# Patient Record
Sex: Female | Born: 1950 | Race: White | Hispanic: No | Smoking: Current every day smoker
Health system: Southern US, Community
[De-identification: ages and names within clinical notes are randomized; demographics above are authoritative.]

## PROBLEM LIST (undated history)

## (undated) DIAGNOSIS — J449 Chronic obstructive pulmonary disease, unspecified: Secondary | ICD-10-CM

## (undated) DIAGNOSIS — K219 Gastro-esophageal reflux disease without esophagitis: Secondary | ICD-10-CM

## (undated) DIAGNOSIS — F32A Depression, unspecified: Secondary | ICD-10-CM

## (undated) DIAGNOSIS — F329 Major depressive disorder, single episode, unspecified: Secondary | ICD-10-CM

## (undated) DIAGNOSIS — E7800 Pure hypercholesterolemia, unspecified: Secondary | ICD-10-CM

## (undated) HISTORY — PX: CHOLECYSTECTOMY: SHX55

## (undated) HISTORY — PX: ABDOMINAL HYSTERECTOMY: SHX81

---

## 2000-12-06 ENCOUNTER — Encounter: Payer: Self-pay | Admitting: Orthopedic Surgery

## 2000-12-06 ENCOUNTER — Encounter: Admission: RE | Admit: 2000-12-06 | Discharge: 2000-12-06 | Payer: Self-pay | Admitting: Orthopedic Surgery

## 2006-04-07 ENCOUNTER — Encounter: Admission: RE | Admit: 2006-04-07 | Discharge: 2006-04-07 | Payer: Self-pay | Admitting: Orthopedic Surgery

## 2009-01-25 ENCOUNTER — Encounter: Admission: RE | Admit: 2009-01-25 | Discharge: 2009-01-25 | Payer: Self-pay | Admitting: Orthopedic Surgery

## 2009-02-08 ENCOUNTER — Encounter: Admission: RE | Admit: 2009-02-08 | Discharge: 2009-02-08 | Payer: Self-pay | Admitting: Orthopedic Surgery

## 2009-12-12 ENCOUNTER — Encounter: Admission: RE | Admit: 2009-12-12 | Discharge: 2009-12-12 | Payer: Self-pay | Admitting: Orthopedic Surgery

## 2011-02-14 ENCOUNTER — Emergency Department (HOSPITAL_BASED_OUTPATIENT_CLINIC_OR_DEPARTMENT_OTHER)
Admission: EM | Admit: 2011-02-14 | Discharge: 2011-02-14 | Disposition: A | Discharge status: Home / Self Care | Payer: 59 | Attending: Emergency Medicine | Admitting: Emergency Medicine

## 2011-02-14 ENCOUNTER — Emergency Department (INDEPENDENT_AMBULATORY_CARE_PROVIDER_SITE_OTHER): Payer: 59

## 2011-02-14 DIAGNOSIS — K8010 Calculus of gallbladder with chronic cholecystitis without obstruction: Secondary | ICD-10-CM

## 2011-02-14 DIAGNOSIS — E785 Hyperlipidemia, unspecified: Secondary | ICD-10-CM | POA: Insufficient documentation

## 2011-02-14 DIAGNOSIS — R109 Unspecified abdominal pain: Secondary | ICD-10-CM | POA: Insufficient documentation

## 2011-02-14 DIAGNOSIS — K219 Gastro-esophageal reflux disease without esophagitis: Secondary | ICD-10-CM | POA: Insufficient documentation

## 2011-02-14 LAB — COMPREHENSIVE METABOLIC PANEL
Alkaline Phosphatase: 78 U/L (ref 39–117)
BUN: 9 mg/dL (ref 6–23)
CO2: 25 mEq/L (ref 19–32)
Chloride: 100 mEq/L (ref 96–112)
GFR calc Af Amer: >60 mL/min (ref >60)
Glucose, Bld: 111 mg/dL — ABNORMAL HIGH (ref 70–99)
Potassium: 3.5 mEq/L (ref 3.5–5.1)
Total Bilirubin: 0.6 mg/dL (ref 0.3–1.2)

## 2011-02-14 LAB — COMPREHENSIVE METABOLIC PANEL WITH GFR
ALT: 14 U/L (ref 0–35)
AST: 21 U/L (ref 0–37)
Albumin: 4.2 g/dL (ref 3.5–5.2)
Calcium: 9.8 mg/dL (ref 8.4–10.5)
Creatinine, Ser: 0.6 mg/dL (ref 0.50–1.10)
GFR calc non Af Amer: >60 mL/min (ref >60)
Sodium: 139 meq/L (ref 135–145)
Total Protein: 7 g/dL (ref 6.0–8.3)

## 2011-02-14 LAB — URINE MICROSCOPIC-ADD ON

## 2011-02-14 LAB — CBC
HCT: 43.3 % (ref 36.0–46.0)
Hemoglobin: 14.7 g/dL (ref 12.0–15.0)
MCH: 29.7 pg (ref 26.0–34.0)
MCHC: 33.9 g/dL (ref 30.0–36.0)
MCV: 87.5 fL (ref 78.0–100.0)
Platelets: 304 K/uL (ref 150–400)
RBC: 4.95 MIL/uL (ref 3.87–5.11)
RDW: 13.9 % (ref 11.5–15.5)
WBC: 14.5 10*3/uL — ABNORMAL HIGH (ref 4.0–10.5)

## 2011-02-14 LAB — URINALYSIS, ROUTINE W REFLEX MICROSCOPIC
Bilirubin Urine: NEGATIVE
Specific Gravity, Urine: 1.009 (ref 1.005–1.030)
Urobilinogen, UA: 0.2 mg/dL (ref 0.0–1.0)

## 2011-02-14 LAB — DIFFERENTIAL
Basophils Absolute: 0 10*3/uL (ref 0.0–0.1)
Basophils Relative: 0 % (ref 0–1)
Eosinophils Absolute: 0.2 K/uL (ref 0.0–0.7)
Eosinophils Relative: 1 % (ref 0–5)
Lymphocytes Relative: 24 % (ref 12–46)
Lymphs Abs: 3.5 K/uL (ref 0.7–4.0)
Monocytes Absolute: 1.2 10*3/uL — ABNORMAL HIGH (ref 0.1–1.0)
Monocytes Relative: 8 % (ref 3–12)
Neutro Abs: 9.6 10*3/uL — ABNORMAL HIGH (ref 1.7–7.7)
Neutrophils Relative %: 67 % (ref 43–77)

## 2011-02-14 LAB — LIPASE, BLOOD: Lipase: 33 U/L (ref 11–59)

## 2011-02-15 LAB — URINE CULTURE
Colony Count: NO GROWTH
Culture  Setup Time: 201206272129
Culture: NO GROWTH

## 2011-06-10 ENCOUNTER — Other Ambulatory Visit: Payer: Self-pay

## 2011-06-10 ENCOUNTER — Encounter: Payer: Self-pay | Admitting: Emergency Medicine

## 2011-06-10 ENCOUNTER — Emergency Department (HOSPITAL_BASED_OUTPATIENT_CLINIC_OR_DEPARTMENT_OTHER)
Admission: EM | Admit: 2011-06-10 | Discharge: 2011-06-10 | Disposition: A | Discharge status: Home / Self Care | Payer: 59 | Attending: Emergency Medicine | Admitting: Emergency Medicine

## 2011-06-10 ENCOUNTER — Emergency Department (INDEPENDENT_AMBULATORY_CARE_PROVIDER_SITE_OTHER): Payer: 59

## 2011-06-10 DIAGNOSIS — J441 Chronic obstructive pulmonary disease with (acute) exacerbation: Secondary | ICD-10-CM | POA: Insufficient documentation

## 2011-06-10 DIAGNOSIS — J449 Chronic obstructive pulmonary disease, unspecified: Secondary | ICD-10-CM

## 2011-06-10 DIAGNOSIS — Z79899 Other long term (current) drug therapy: Secondary | ICD-10-CM | POA: Insufficient documentation

## 2011-06-10 DIAGNOSIS — F17200 Nicotine dependence, unspecified, uncomplicated: Secondary | ICD-10-CM | POA: Insufficient documentation

## 2011-06-10 DIAGNOSIS — F3289 Other specified depressive episodes: Secondary | ICD-10-CM | POA: Insufficient documentation

## 2011-06-10 DIAGNOSIS — F329 Major depressive disorder, single episode, unspecified: Secondary | ICD-10-CM | POA: Insufficient documentation

## 2011-06-10 DIAGNOSIS — R0602 Shortness of breath: Secondary | ICD-10-CM | POA: Insufficient documentation

## 2011-06-10 DIAGNOSIS — E7800 Pure hypercholesterolemia, unspecified: Secondary | ICD-10-CM | POA: Insufficient documentation

## 2011-06-10 DIAGNOSIS — J90 Pleural effusion, not elsewhere classified: Secondary | ICD-10-CM

## 2011-06-10 DIAGNOSIS — J4489 Other specified chronic obstructive pulmonary disease: Secondary | ICD-10-CM

## 2011-06-10 DIAGNOSIS — K219 Gastro-esophageal reflux disease without esophagitis: Secondary | ICD-10-CM | POA: Insufficient documentation

## 2011-06-10 HISTORY — DX: Major depressive disorder, single episode, unspecified: F32.9

## 2011-06-10 HISTORY — DX: Gastro-esophageal reflux disease without esophagitis: K21.9

## 2011-06-10 HISTORY — DX: Depression, unspecified: F32.A

## 2011-06-10 HISTORY — DX: Pure hypercholesterolemia, unspecified: E78.00

## 2011-06-10 MED ORDER — PREDNISONE 10 MG PO TABS
ORAL_TABLET | ORAL | Status: AC
  Filled 2011-06-10: qty 1

## 2011-06-10 MED ORDER — ALBUTEROL SULFATE HFA 108 (90 BASE) MCG/ACT IN AERS
2.0000 | INHALATION_SPRAY | Freq: Four times a day (QID) | RESPIRATORY_TRACT | Status: DC | PRN
  Administered 2011-06-10: 2 via RESPIRATORY_TRACT
  Filled 2011-06-10: qty 6.7

## 2011-06-10 MED ORDER — PREDNISONE 50 MG PO TABS: ORAL_TABLET | ORAL | Status: AC

## 2011-06-10 MED ORDER — IPRATROPIUM BROMIDE 0.02 % IN SOLN
0.5000 mg | Freq: Once | RESPIRATORY_TRACT | Status: AC
  Administered 2011-06-10: 0.5 mg via RESPIRATORY_TRACT

## 2011-06-10 MED ORDER — ALBUTEROL SULFATE (5 MG/ML) 0.5% IN NEBU
5.0000 mg | INHALATION_SOLUTION | Freq: Once | RESPIRATORY_TRACT | Status: AC
  Administered 2011-06-10: 5 mg via RESPIRATORY_TRACT
  Filled 2011-06-10: qty 1

## 2011-06-10 MED ORDER — PREDNISONE 50 MG PO TABS
ORAL_TABLET | ORAL | Status: AC
  Filled 2011-06-10: qty 1

## 2011-06-10 MED ORDER — PREDNISONE 50 MG PO TABS
60.0000 mg | ORAL_TABLET | Freq: Once | ORAL | Status: AC
  Administered 2011-06-10: 60 mg via ORAL

## 2011-06-10 MED ORDER — ALBUTEROL SULFATE (5 MG/ML) 0.5% IN NEBU
INHALATION_SOLUTION | RESPIRATORY_TRACT | Status: AC
  Filled 2011-06-10: qty 0.5

## 2011-06-10 MED ORDER — PREDNISONE 50 MG PO TABS: 60.0000 mg | ORAL_TABLET | Freq: Every day | ORAL | Status: DC

## 2011-06-10 MED ORDER — ALBUTEROL SULFATE (5 MG/ML) 0.5% IN NEBU
2.5000 mg | INHALATION_SOLUTION | Freq: Once | RESPIRATORY_TRACT | Status: AC
  Administered 2011-06-10: 2.5 mg via RESPIRATORY_TRACT

## 2011-06-10 MED ORDER — IPRATROPIUM BROMIDE 0.02 % IN SOLN
RESPIRATORY_TRACT | Status: AC
  Filled 2011-06-10: qty 2.5

## 2011-06-10 NOTE — ED Notes (Signed)
Pt sitting in chair in room. O2 sat 91 %. Encouraged to take deep breaths. Sats up to 95%.

## 2011-06-10 NOTE — ED Notes (Signed)
Albuterol 2.5mg  / Atrovent 0.5mg  Neb given.  Pt states "feels like I can fill my lungs up now.  Feels like it has lossen up."

## 2011-06-10 NOTE — ED Notes (Signed)
MDI with Aerochamber given.  Tolerated well. No questions asked.  RX label attached and filled out.

## 2011-06-10 NOTE — ED Provider Notes (Signed)
History     CSN: [id number] Arrival date & time: 06/10/2011 10:09 AM   First MD Initiated Contact with Patient 06/10/11 1055      Chief Complaint  Patient presents with  . Shortness of Breath    (Consider location/radiation/quality/duration/timing/severity/associated sxs/prior treatment) Patient is a 60 y.o. female presenting with shortness of breath. The history is provided by the patient.  Shortness of Breath  The current episode started 2 days ago. The onset was gradual. The problem occurs continuously. The problem is moderate. The symptoms are relieved by nothing. The symptoms are aggravated by nothing. Associated symptoms include cough and shortness of breath. Pertinent negatives include no chest pain, no chest pressure and no fever. The cough is harsh (yellow). Nothing relieves the cough. Past medical history comments: history of bronchitis.    Past Medical History  Diagnosis Date  . GERD (gastroesophageal reflux disease)   . Depression   . Hypercholesteremia     Past Surgical History  Procedure Date  . Cholecystectomy   . Abdominal hysterectomy     History reviewed. No pertinent family history.  History  Substance Use Topics  . Smoking status: Current Everyday Smoker -- 0.5 packs/day    Types: Cigarettes  . Smokeless tobacco: Not on file  . Alcohol Use: Yes     occasional    OB History    Grav Para Term Preterm Abortions TAB SAB Ect Mult Living                  Review of Systems  Constitutional: Negative for fever.  Respiratory: Positive for cough and shortness of breath.   Cardiovascular: Negative for chest pain.  All other systems reviewed and are negative.    Allergies  Review of patient's allergies indicates no known allergies.  Home Medications   Current Outpatient Rx  Name Route Sig Dispense Refill  . ESTRADIOL 0.75 MG/1.25 GM (0.06%) TD GEL Transdermal Place 1.25 g onto the skin daily.      Marland Kitchen EZETIMIBE-SIMVASTATIN 10-40 MG PO TABS Oral  Take 1 tablet by mouth at bedtime.      . FLUOXETINE HCL 20 MG PO CAPS Oral Take 20 mg by mouth daily.      Marland Kitchen LORAZEPAM 0.5 MG PO TABS Oral Take 0.5 mg by mouth every 8 (eight) hours.      . OMEPRAZOLE 10 MG PO CPDR Oral Take 10 mg by mouth daily.        BP 120/69  Pulse 80  Temp(Src) 98.1 F (36.7 C) (Oral)  Resp 20  Ht 5' 2.5" (1.588 m)  Wt 135 lb (61.236 kg)  BMI 24.30 kg/m2  SpO2 92%  Physical Exam  Nursing note and vitals reviewed. Constitutional: She appears well-developed and well-nourished. No distress.  HENT:  Head: Normocephalic and atraumatic.  Right Ear: External ear normal.  Left Ear: External ear normal.  Eyes: Conjunctivae are normal. Right eye exhibits no discharge. Left eye exhibits no discharge. No scleral icterus.  Neck: Neck supple. No tracheal deviation present.  Cardiovascular: Normal rate, regular rhythm and intact distal pulses.   Pulmonary/Chest: Effort normal. No accessory muscle usage or stridor. No respiratory distress. She has wheezes. She has no rales.  Abdominal: Soft. Bowel sounds are normal. She exhibits no distension. There is no tenderness. There is no rebound and no guarding.  Musculoskeletal: She exhibits no edema and no tenderness.  Neurological: She is alert. She has normal strength. No sensory deficit. Cranial nerve deficit:  no gross defecits noted.  She exhibits normal muscle tone. She displays no seizure activity. Coordination normal.  Skin: Skin is warm and dry. No rash noted. She is not diaphoretic.  Psychiatric: She has a normal mood and affect.    ED Course  Procedures (including critical care time)  Patient was treated with albuterol/Atrovent nebulizer treatments. She was also given oral steroids.  The patient is noted significant improvement. She states that she is breathing more easily at this time. Patient's baseline pulse ox is still in the low 90s which I suspect is somewhat chronic with her smoking history.   Date:  06/10/2011  Rate: 78  Rhythm: normal sinus rhythm  QRS Axis: normal  Intervals: normal  ST/T Wave abnormalities: normal  Conduction Disutrbances:none  Narrative Interpretation:   Old EKG Reviewed: none available and unchanged   Labs Reviewed - No data to display Dg Chest 2 View  06/10/2011  *RADIOLOGY REPORT*  Clinical Data: Breath.  Smoker.  CHEST - 2 VIEW  Comparison: None.  Findings: The chest is hyperexpanded and there is peribronchial thickening.  No consolidative process.  Tiny amount of pleural fluid on the left is noted.  No pneumothorax.  Heart size normal.  IMPRESSION: COPD and tiny left pleural effusion.  Original Report Authenticated By: Bernadene Bell. D'ALESSIO, M.D.     1. COPD exacerbation   MDM  Patient was treated for COPD exacerbation with good results. There is no evidence of pneumonia. I doubt any acute cardiac etiology. I doubt pulmonary embolus. Patient states she's been feeling better. I discussed smoking cessation with her as well. Patient was discharged home on an albuterol inhaler with a steroid course and she is counseled to followup with her primary care doctor to be rechecked this week        Celene Kras, MD 06/10/11 1248

## 2011-06-10 NOTE — ED Notes (Signed)
Pt started with cold symptoms on Wednesday, worse last two days.  Pt states she has productive cough, thick yellow sputum.  Pt having some exp wheezing, RT at bedside on arrival.  No acute resp distress noted. Some fever.  No n/v/d.

## 2011-11-01 ENCOUNTER — Emergency Department (INDEPENDENT_AMBULATORY_CARE_PROVIDER_SITE_OTHER): Payer: 59

## 2011-11-01 ENCOUNTER — Encounter (HOSPITAL_BASED_OUTPATIENT_CLINIC_OR_DEPARTMENT_OTHER): Payer: Self-pay | Admitting: Emergency Medicine

## 2011-11-01 ENCOUNTER — Emergency Department (HOSPITAL_BASED_OUTPATIENT_CLINIC_OR_DEPARTMENT_OTHER)
Admission: EM | Admit: 2011-11-01 | Discharge: 2011-11-01 | Disposition: A | Discharge status: Home Health | Payer: 59 | Attending: Emergency Medicine | Admitting: Emergency Medicine

## 2011-11-01 DIAGNOSIS — R059 Cough, unspecified: Secondary | ICD-10-CM | POA: Insufficient documentation

## 2011-11-01 DIAGNOSIS — R05 Cough: Secondary | ICD-10-CM

## 2011-11-01 DIAGNOSIS — R0609 Other forms of dyspnea: Secondary | ICD-10-CM | POA: Insufficient documentation

## 2011-11-01 DIAGNOSIS — J40 Bronchitis, not specified as acute or chronic: Secondary | ICD-10-CM

## 2011-11-01 DIAGNOSIS — J441 Chronic obstructive pulmonary disease with (acute) exacerbation: Secondary | ICD-10-CM

## 2011-11-01 DIAGNOSIS — R918 Other nonspecific abnormal finding of lung field: Secondary | ICD-10-CM

## 2011-11-01 DIAGNOSIS — J449 Chronic obstructive pulmonary disease, unspecified: Secondary | ICD-10-CM | POA: Insufficient documentation

## 2011-11-01 DIAGNOSIS — E7800 Pure hypercholesterolemia, unspecified: Secondary | ICD-10-CM | POA: Insufficient documentation

## 2011-11-01 DIAGNOSIS — R0989 Other specified symptoms and signs involving the circulatory and respiratory systems: Secondary | ICD-10-CM | POA: Insufficient documentation

## 2011-11-01 DIAGNOSIS — R062 Wheezing: Secondary | ICD-10-CM

## 2011-11-01 DIAGNOSIS — J4489 Other specified chronic obstructive pulmonary disease: Secondary | ICD-10-CM | POA: Insufficient documentation

## 2011-11-01 MED ORDER — IPRATROPIUM BROMIDE 0.02 % IN SOLN
0.5000 mg | Freq: Once | RESPIRATORY_TRACT | Status: AC
  Administered 2011-11-01: 0.5 mg via RESPIRATORY_TRACT

## 2011-11-01 MED ORDER — PREDNISONE 10 MG PO TABS
60.0000 mg | ORAL_TABLET | Freq: Once | ORAL | Status: AC
  Administered 2011-11-01: 60 mg via ORAL
  Filled 2011-11-01: qty 1

## 2011-11-01 MED ORDER — PREDNISONE 20 MG PO TABS: 60.0000 mg | ORAL_TABLET | Freq: Every day | ORAL | Status: AC

## 2011-11-01 MED ORDER — ALBUTEROL SULFATE HFA 108 (90 BASE) MCG/ACT IN AERS
INHALATION_SPRAY | RESPIRATORY_TRACT | Status: AC
  Filled 2011-11-01: qty 6.7

## 2011-11-01 MED ORDER — AZITHROMYCIN 250 MG PO TABS: 250.0000 mg | ORAL_TABLET | Freq: Every day | ORAL | Status: AC

## 2011-11-01 MED ORDER — ALBUTEROL SULFATE (5 MG/ML) 0.5% IN NEBU
5.0000 mg | INHALATION_SOLUTION | Freq: Once | RESPIRATORY_TRACT | Status: AC
  Administered 2011-11-01: 5 mg via RESPIRATORY_TRACT

## 2011-11-01 MED ORDER — ALBUTEROL SULFATE HFA 108 (90 BASE) MCG/ACT IN AERS: 2.0000 | INHALATION_SPRAY | Freq: Four times a day (QID) | RESPIRATORY_TRACT | Status: DC

## 2011-11-01 MED ORDER — IPRATROPIUM BROMIDE 0.02 % IN SOLN
RESPIRATORY_TRACT | Status: AC
  Administered 2011-11-01: 0.5 mg via RESPIRATORY_TRACT
  Filled 2011-11-01: qty 2.5

## 2011-11-01 MED ORDER — AZITHROMYCIN 250 MG PO TABS
500.0000 mg | ORAL_TABLET | Freq: Once | ORAL | Status: AC
  Administered 2011-11-01: 500 mg via ORAL
  Filled 2011-11-01: qty 2

## 2011-11-01 MED ORDER — ALBUTEROL SULFATE (5 MG/ML) 0.5% IN NEBU
INHALATION_SOLUTION | RESPIRATORY_TRACT | Status: AC
  Administered 2011-11-01: 5 mg via RESPIRATORY_TRACT
  Filled 2011-11-01: qty 1

## 2011-11-01 NOTE — Discharge Instructions (Signed)

## 2011-11-01 NOTE — ED Provider Notes (Signed)
History     CSN: [id number]  Arrival date & time 11/01/11  0542   First MD Initiated Contact with Patient 11/01/11 0544      Chief Complaint  Patient presents with  . Wheezing  . Cough    (Consider location/radiation/quality/duration/timing/severity/associated sxs/prior treatment) HPI The patient presents with dyspnea.  She notes that approximately one week ago she gradually developed URI like symptoms.  Over the past week she has had mild cough, generalized discomfort.  Yesterday, she developed dyspnea.  Since that time symptoms have not been relieved by anything, seemingly worsening substantially.  The patient denies any pain, lightheadedness, syncope, nausea, vomiting, diarrhea. The patient is still a smoker. Past Medical History  Diagnosis Date  . GERD (gastroesophageal reflux disease)   . Depression   . Hypercholesteremia     Past Surgical History  Procedure Date  . Cholecystectomy   . Abdominal hysterectomy     No family history on file.  History  Substance Use Topics  . Smoking status: Current Everyday Smoker -- 0.5 packs/day    Types: Cigarettes  . Smokeless tobacco: Not on file  . Alcohol Use: Yes     occasional    OB History    Grav Para Term Preterm Abortions TAB SAB Ect Mult Living                  Review of Systems  Constitutional:       HPI  HENT:       HPI otherwise negative  Eyes: Negative.   Respiratory:       HPI, otherwise negative  Cardiovascular:       HPI, otherwise nmegative  Gastrointestinal: Negative for vomiting.  Genitourinary:       HPI, otherwise negative  Musculoskeletal:       HPI, otherwise negative  Skin: Negative.   Neurological: Negative for syncope.    Allergies  Review of patient's allergies indicates no known allergies.  Home Medications   Current Outpatient Rx  Name Route Sig Dispense Refill  . ESTRADIOL 0.75 MG/1.25 GM (0.06%) TD GEL Transdermal Place 1.25 g onto the skin daily.      Marland Kitchen  EZETIMIBE-SIMVASTATIN 10-40 MG PO TABS Oral Take 1 tablet by mouth at bedtime.      . FLUOXETINE HCL 20 MG PO CAPS Oral Take 20 mg by mouth daily.      Marland Kitchen LORAZEPAM 0.5 MG PO TABS Oral Take 0.5 mg by mouth every 8 (eight) hours.      . OMEPRAZOLE 10 MG PO CPDR Oral Take 10 mg by mouth daily.        BP 109/81  Pulse 85  Temp(Src) 97.6 F (36.4 C) (Oral)  Resp 20  SpO2 97%  Physical Exam  Nursing note and vitals reviewed. Constitutional: She is oriented to person, place, and time. She appears well-developed and well-nourished. No distress.  HENT:  Head: Normocephalic and atraumatic.  Eyes: Conjunctivae and EOM are normal.  Cardiovascular: Normal rate and regular rhythm.   Pulmonary/Chest: Effort normal. No stridor. No respiratory distress. She has wheezes. She has rales.  Abdominal: She exhibits no distension.  Musculoskeletal: She exhibits no edema.  Neurological: She is alert and oriented to person, place, and time. No cranial nerve deficit.  Skin: Skin is warm and dry.  Psychiatric: She has a normal mood and affect.    ED Course  Procedures (including critical care time)  Labs Reviewed - No data to display No results found.   No  diagnosis found.  Chest x-ray reviewed by me  Pulse oximetry 97% on facemask abnormal  6:42 AM Patient is notably improved.  MDM  This female with COPD, currently smoking, now presents with dyspnea.  The patient's denial of pain, fever, altered mental status his reassuring.  On exam the patient is wheezing.  The patient's XR did not demonstrate pneumonia.  She was discharged in stable condition to follow up with her primary care physician      Gerhard Munch, MD 11/01/11 [phone]

## 2011-11-01 NOTE — ED Notes (Signed)
Pt with cough x several day with shob and wheezing this morning.

## 2013-06-27 ENCOUNTER — Emergency Department (HOSPITAL_BASED_OUTPATIENT_CLINIC_OR_DEPARTMENT_OTHER): Payer: 59

## 2013-06-27 ENCOUNTER — Emergency Department (HOSPITAL_BASED_OUTPATIENT_CLINIC_OR_DEPARTMENT_OTHER)
Admission: EM | Admit: 2013-06-27 | Discharge: 2013-06-27 | Disposition: A | Discharge status: Other Acute Inpatient Hospital | Payer: 59 | Attending: Emergency Medicine | Admitting: Emergency Medicine

## 2013-06-27 ENCOUNTER — Encounter (HOSPITAL_BASED_OUTPATIENT_CLINIC_OR_DEPARTMENT_OTHER): Payer: Self-pay | Admitting: Emergency Medicine

## 2013-06-27 DIAGNOSIS — J441 Chronic obstructive pulmonary disease with (acute) exacerbation: Secondary | ICD-10-CM

## 2013-06-27 DIAGNOSIS — IMO0002 Reserved for concepts with insufficient information to code with codable children: Secondary | ICD-10-CM | POA: Insufficient documentation

## 2013-06-27 DIAGNOSIS — K219 Gastro-esophageal reflux disease without esophagitis: Secondary | ICD-10-CM | POA: Insufficient documentation

## 2013-06-27 DIAGNOSIS — F17200 Nicotine dependence, unspecified, uncomplicated: Secondary | ICD-10-CM | POA: Insufficient documentation

## 2013-06-27 DIAGNOSIS — F3289 Other specified depressive episodes: Secondary | ICD-10-CM | POA: Insufficient documentation

## 2013-06-27 DIAGNOSIS — E7800 Pure hypercholesterolemia, unspecified: Secondary | ICD-10-CM | POA: Insufficient documentation

## 2013-06-27 DIAGNOSIS — F329 Major depressive disorder, single episode, unspecified: Secondary | ICD-10-CM | POA: Insufficient documentation

## 2013-06-27 DIAGNOSIS — Z79899 Other long term (current) drug therapy: Secondary | ICD-10-CM | POA: Insufficient documentation

## 2013-06-27 HISTORY — DX: Chronic obstructive pulmonary disease, unspecified: J44.9

## 2013-06-27 LAB — CBC WITH DIFFERENTIAL/PLATELET
Basophils Absolute: 0 10*3/uL (ref 0.0–0.1)
Basophils Relative: 0 % (ref 0–1)
Eosinophils Absolute: 0.5 10*3/uL (ref 0.0–0.7)
HCT: 46.9 % — ABNORMAL HIGH (ref 36.0–46.0)
Lymphs Abs: 4.1 10*3/uL — ABNORMAL HIGH (ref 0.7–4.0)
MCHC: 32.8 g/dL (ref 30.0–36.0)
MCV: 92.7 fL (ref 78.0–100.0)
Monocytes Relative: 8 % (ref 3–12)
Neutro Abs: 11.8 10*3/uL — ABNORMAL HIGH (ref 1.7–7.7)
Platelets: 432 10*3/uL — ABNORMAL HIGH (ref 150–400)
RDW: 15.1 % (ref 11.5–15.5)
WBC: 17.8 10*3/uL — ABNORMAL HIGH (ref 4.0–10.5)

## 2013-06-27 LAB — TROPONIN I: Troponin I: <0.3 ng/mL (ref <0.30)

## 2013-06-27 LAB — COMPREHENSIVE METABOLIC PANEL
Albumin: 3.9 g/dL (ref 3.5–5.2)
Alkaline Phosphatase: 94 U/L (ref 39–117)
BUN: 6 mg/dL (ref 6–23)
Calcium: 9.9 mg/dL (ref 8.4–10.5)
Creatinine, Ser: 0.5 mg/dL (ref 0.50–1.10)
GFR calc Af Amer: >90 mL/min (ref >90)
Glucose, Bld: 162 mg/dL — ABNORMAL HIGH (ref 70–99)
Potassium: 3.7 mEq/L (ref 3.5–5.1)
Total Protein: 7.5 g/dL (ref 6.0–8.3)

## 2013-06-27 MED ORDER — ALBUTEROL SULFATE (5 MG/ML) 0.5% IN NEBU
2.5000 mg | INHALATION_SOLUTION | Freq: Once | RESPIRATORY_TRACT | Status: AC
  Administered 2013-06-27: 2.5 mg via RESPIRATORY_TRACT
  Filled 2013-06-27: qty 0.5

## 2013-06-27 MED ORDER — DEXTROSE 5 % IV SOLN
500.0000 mg | Freq: Once | INTRAVENOUS | Status: AC
  Administered 2013-06-27: 500 mg via INTRAVENOUS

## 2013-06-27 MED ORDER — ALBUTEROL SULFATE (5 MG/ML) 0.5% IN NEBU
5.0000 mg | INHALATION_SOLUTION | Freq: Once | RESPIRATORY_TRACT | Status: AC
  Administered 2013-06-27: 5 mg via RESPIRATORY_TRACT
  Filled 2013-06-27: qty 1

## 2013-06-27 MED ORDER — DEXTROSE 5 % IV SOLN
1.0000 g | Freq: Once | INTRAVENOUS | Status: AC
  Administered 2013-06-27: 1 g via INTRAVENOUS

## 2013-06-27 MED ORDER — IPRATROPIUM BROMIDE 0.02 % IN SOLN
0.5000 mg | Freq: Once | RESPIRATORY_TRACT | Status: AC
  Administered 2013-06-27: 0.5 mg via RESPIRATORY_TRACT
  Filled 2013-06-27: qty 2.5

## 2013-06-27 MED ORDER — METHYLPREDNISOLONE SODIUM SUCC 125 MG IJ SOLR
125.0000 mg | Freq: Once | INTRAMUSCULAR | Status: AC
  Administered 2013-06-27: 125 mg via INTRAVENOUS
  Filled 2013-06-27: qty 2

## 2013-06-27 MED ORDER — CEFTRIAXONE SODIUM 1 G IJ SOLR
INTRAMUSCULAR | Status: AC
  Filled 2013-06-27: qty 10

## 2013-06-27 NOTE — ED Notes (Signed)
Pt has been congested this week. Last night she became SOB and it has gotten progressively worse. Pt reports no relief from her albuterol tx or inhaler.

## 2013-06-27 NOTE — ED Notes (Signed)
Pt showing improvement with NRB. Now able to speak in long phrases and sts that she feels like she is breathing better. RT at bedside administering breathing tx.

## 2013-06-27 NOTE — ED Provider Notes (Addendum)
CSN: [id number]     Arrival date & time 06/27/13  0457 History   First MD Initiated Contact with Patient 06/27/13 0503     Chief Complaint  Patient presents with  . Shortness of Breath   (Consider location/radiation/quality/duration/timing/severity/associated sxs/prior Treatment) Patient is a 61 y.o. female presenting with wheezing. The history is provided by a relative.  Wheezing Severity:  Severe Severity compared to prior episodes:  More severe Onset quality:  Gradual Timing:  Constant Progression:  Worsening Chronicity:  Recurrent Context: not exposure to allergen and not strong odors   Relieved by:  Nothing Worsened by:  Nothing tried Associated symptoms: cough and sputum production   Associated symptoms: no chest pain, no chest tightness, no fever, no orthopnea and no stridor   Cough:    Cough characteristics:  Productive   Sputum characteristics:  White (with tan)   Severity:  Moderate   Onset quality:  Gradual   Timing:  Constant   Progression:  Unchanged   Chronicity:  Recurrent Risk factors: no prior ICU admissions     Past Medical History  Diagnosis Date  . GERD (gastroesophageal reflux disease)   . Depression   . Hypercholesteremia   . COPD (chronic obstructive pulmonary disease)    Past Surgical History  Procedure Laterality Date  . Cholecystectomy    . Abdominal hysterectomy     No family history on file. History  Substance Use Topics  . Smoking status: Current Every Day Smoker -- 0.50 packs/day    Types: Cigarettes  . Smokeless tobacco: Not on file  . Alcohol Use: Yes     Comment: occasional   OB History   Grav Para Term Preterm Abortions TAB SAB Ect Mult Living                 Review of Systems  Constitutional: Negative for fever, chills and diaphoresis.  Respiratory: Positive for cough, sputum production and wheezing. Negative for chest tightness and stridor.   Cardiovascular: Negative for chest pain, palpitations, orthopnea and leg  swelling.  Gastrointestinal: Negative for nausea and vomiting.  All other systems reviewed and are negative.    Allergies  Review of patient's allergies indicates no known allergies.  Home Medications   Current Outpatient Rx  Name  Route  Sig  Dispense  Refill  . albuterol (PROVENTIL HFA;VENTOLIN HFA) 108 (90 BASE) MCG/ACT inhaler   Inhalation   Inhale 1-2 puffs into the lungs every 6 (six) hours as needed for wheezing or shortness of breath.         . Fluticasone-Salmeterol (ADVAIR) 100-50 MCG/DOSE AEPB   Inhalation   Inhale 1 puff into the lungs 2 (two) times daily.         Marland Kitchen ipratropium-albuterol (DUONEB) 0.5-2.5 (3) MG/3ML SOLN   Nebulization   Take 3 mLs by nebulization.         Marland Kitchen tiotropium (SPIRIVA) 18 MCG inhalation capsule   Inhalation   Place 18 mcg into inhaler and inhale daily.         . Estradiol (ESTROGEL) 0.75 MG/1.25 GM (0.06%) topical gel   Transdermal   Place 1.25 g onto the skin daily.           Marland Kitchen ezetimibe-simvastatin (VYTORIN) 10-40 MG per tablet   Oral   Take 1 tablet by mouth at bedtime.           Marland Kitchen FLUoxetine (PROZAC) 20 MG capsule   Oral   Take 20 mg by mouth daily.           Marland Kitchen  LORazepam (ATIVAN) 0.5 MG tablet   Oral   Take 0.5 mg by mouth every 8 (eight) hours.           Marland Kitchen omeprazole (PRILOSEC) 10 MG capsule   Oral   Take 10 mg by mouth daily.            BP 102/70  Pulse 108  Resp 28  Wt 135 lb (61.236 kg)  SpO2 95% Physical Exam  Constitutional: She is oriented to person, place, and time. She appears well-developed and well-nourished.  HENT:  Head: Normocephalic and atraumatic.  Right Ear: External ear normal.  Left Ear: External ear normal.  Eyes: Conjunctivae are normal. Pupils are equal, round, and reactive to light.  Neck: Normal range of motion. Neck supple. No tracheal deviation present.  Pulmonary/Chest: No stridor. Tachypnea noted. She is in respiratory distress. She has decreased breath sounds. She  has wheezes. She has rhonchi. She has no rales.  Originally in distress  Abdominal: Soft. Bowel sounds are normal. There is no tenderness. There is no rebound and no guarding.  Musculoskeletal: Normal range of motion.  Neurological: She is alert and oriented to person, place, and time.  Skin: Skin is warm and dry. She is not diaphoretic.  Psychiatric: She has a normal mood and affect.    ED Course  Procedures (including critical care time) Labs Review Labs Reviewed - No data to display Imaging Review No results found.  EKG Interpretation   None       MDM  COPD exacerbation  MDM Reviewed: previous chart, nursing note and vitals Reviewed previous: labs Interpretation: labs, ECG and x-ray (Leukocytosis and mild thrombocytosis.  ) Consults: admitting MD     Medications  cefTRIAXone (ROCEPHIN) 1 g in dextrose 5 % 50 mL IVPB (not administered)  azithromycin (ZITHROMAX) 500 mg in dextrose 5 % 250 mL IVPB (not administered)  albuterol (PROVENTIL) (5 MG/ML) 0.5% nebulizer solution 5 mg (not administered)  albuterol (PROVENTIL) (5 MG/ML) 0.5% nebulizer solution 5 mg (5 mg Nebulization Given 06/27/13 0517)  ipratropium (ATROVENT) nebulizer solution 0.5 mg (0.5 mg Nebulization Given 06/27/13 0517)  albuterol (PROVENTIL) (5 MG/ML) 0.5% nebulizer solution 5 mg (5 mg Nebulization Given 06/27/13 0536)  ipratropium (ATROVENT) nebulizer solution 0.5 mg (0.5 mg Nebulization Given 06/27/13 0536)  methylPREDNISolone sodium succinate (SOLU-MEDROL) 125 mg/2 mL injection 125 mg (125 mg Intravenous Given 06/27/13 0559)  albuterol (PROVENTIL) (5 MG/ML) 0.5% nebulizer solution 2.5 mg (2.5 mg Nebulization Given 06/27/13 0605)   CRITICAL CARE Performed by: Jasmine Awe Total critical care time: 90 minutes Critical care time was exclusive of separately billable procedures and treating other patients. Critical care was necessary to treat or prevent imminent or life-threatening  deterioration. Critical care was time spent personally by me on the following activities: development of treatment plan with patient and/or surrogate as well as nursing, discussions with consultants, evaluation of patient's response to treatment, examination of patient, obtaining history from patient or surrogate, ordering and performing treatments and interventions, ordering and review of laboratory studies, ordering and review of radiographic studies, pulse oximetry and re-evaluation of patient's condition.   Date: 114  Rhythm: sinus tachycardia  QRS Axis: normal  Intervals: normal  ST/T Wave abnormalities: normal  Conduction Disutrbances:none  Narrative Interpretation:   Old EKG Reviewed: none available  Patient steadily improving with decreased WOB and RR and now speaking in complete sentences.  No further rhonchi but wheezing more prominent in all fields consistent with patient opening up.  Patient beginning to  expectorate more mucus.  Patient states she feels much better but O2 sats are not staying above 90% on room air.  As patient does not use home O2 at this time she will require admission and will continue to require will need nebs and steroids in order to clear wheezing.  Doubt cardiac etiology and doubt PE.  Symptoms are consistent with patient's typical COPD exacerbation but worse than usual      Zinia Innocent K Semya Klinke-Rasch, MD 06/27/13 1610  Lauren Modisette K Donielle Radziewicz-Rasch, MD 06/27/13 [phone]

## 2013-06-27 NOTE — ED Notes (Signed)
RT at bedside upon arrival. Pt resp shallow and labored with audible rhonchi. RA sats at 75%. Pt placed on [place] with no relief. Pt switched to NRB and sat increased to 100%. Dr. Nicanor Alcon at bedside.

## 2014-02-18 ENCOUNTER — Emergency Department (HOSPITAL_BASED_OUTPATIENT_CLINIC_OR_DEPARTMENT_OTHER)
Admission: EM | Admit: 2014-02-18 | Discharge: 2014-02-18 | Disposition: A | Discharge status: Home / Self Care | Payer: 59 | Attending: Emergency Medicine | Admitting: Emergency Medicine

## 2014-02-18 ENCOUNTER — Encounter (HOSPITAL_BASED_OUTPATIENT_CLINIC_OR_DEPARTMENT_OTHER): Payer: Self-pay | Admitting: Emergency Medicine

## 2014-02-18 DIAGNOSIS — F3289 Other specified depressive episodes: Secondary | ICD-10-CM | POA: Insufficient documentation

## 2014-02-18 DIAGNOSIS — J449 Chronic obstructive pulmonary disease, unspecified: Secondary | ICD-10-CM | POA: Insufficient documentation

## 2014-02-18 DIAGNOSIS — Z79899 Other long term (current) drug therapy: Secondary | ICD-10-CM | POA: Insufficient documentation

## 2014-02-18 DIAGNOSIS — F329 Major depressive disorder, single episode, unspecified: Secondary | ICD-10-CM | POA: Insufficient documentation

## 2014-02-18 DIAGNOSIS — F17200 Nicotine dependence, unspecified, uncomplicated: Secondary | ICD-10-CM | POA: Insufficient documentation

## 2014-02-18 DIAGNOSIS — J4489 Other specified chronic obstructive pulmonary disease: Secondary | ICD-10-CM | POA: Insufficient documentation

## 2014-02-18 DIAGNOSIS — IMO0002 Reserved for concepts with insufficient information to code with codable children: Secondary | ICD-10-CM | POA: Insufficient documentation

## 2014-02-18 DIAGNOSIS — B029 Zoster without complications: Secondary | ICD-10-CM | POA: Insufficient documentation

## 2014-02-18 DIAGNOSIS — E7800 Pure hypercholesterolemia, unspecified: Secondary | ICD-10-CM | POA: Insufficient documentation

## 2014-02-18 DIAGNOSIS — K219 Gastro-esophageal reflux disease without esophagitis: Secondary | ICD-10-CM | POA: Insufficient documentation

## 2014-02-18 MED ORDER — VALACYCLOVIR HCL 1 G PO TABS: 1000.0000 mg | ORAL_TABLET | Freq: Three times a day (TID) | ORAL | Status: AC

## 2014-02-18 MED ORDER — OXYCODONE-ACETAMINOPHEN 5-325 MG PO TABS: 1.0000 | ORAL_TABLET | Freq: Four times a day (QID) | ORAL | Status: DC | PRN

## 2014-02-18 NOTE — ED Provider Notes (Signed)
CSN: [id number]     Arrival date & time 02/18/14  1801 History   First MD Initiated Contact with Patient 02/18/14 1830     Chief Complaint  Patient presents with  . Rash     (Consider location/radiation/quality/duration/timing/severity/associated sxs/prior Treatment) HPI Comments: Pt states that she has been having right sided abdominal pain for several days and was seen in the er and had a scan and then today a rash broke out in the area:no fever  Patient is a 63 y.o. female presenting with rash. The history is provided by the patient. No language interpreter was used.  Rash Location:  Torso Quality: blistering, painful and redness   Pain details:    Quality:  Aching and burning   Timing:  Constant   Progression:  Unchanged   Past Medical History  Diagnosis Date  . GERD (gastroesophageal reflux disease)   . Depression   . Hypercholesteremia   . COPD (chronic obstructive pulmonary disease)    Past Surgical History  Procedure Laterality Date  . Cholecystectomy    . Abdominal hysterectomy     No family history on file. History  Substance Use Topics  . Smoking status: Current Every Day Smoker -- 0.50 packs/day    Types: Cigarettes  . Smokeless tobacco: Not on file  . Alcohol Use: Yes     Comment: occasional   OB History   Grav Para Term Preterm Abortions TAB SAB Ect Mult Living                 Review of Systems  Constitutional: Negative.   Respiratory: Negative.   Cardiovascular: Negative.   Skin: Positive for rash.      Allergies  Review of patient's allergies indicates no known allergies.  Home Medications   Prior to Admission medications   Medication Sig Start Date End Date Taking? Authorizing Provider  albuterol (PROVENTIL HFA;VENTOLIN HFA) 108 (90 BASE) MCG/ACT inhaler Inhale 1-2 puffs into the lungs every 6 (six) hours as needed for wheezing or shortness of breath.    Historical Provider, MD  Estradiol (ESTROGEL) 0.75 MG/1.25 GM (0.06%) topical gel  Place 1.25 g onto the skin daily.      Historical Provider, MD  ezetimibe-simvastatin (VYTORIN) 10-40 MG per tablet Take 1 tablet by mouth at bedtime.      Historical Provider, MD  FLUoxetine (PROZAC) 20 MG capsule Take 20 mg by mouth daily.      Historical Provider, MD  Fluticasone-Salmeterol (ADVAIR) 100-50 MCG/DOSE AEPB Inhale 1 puff into the lungs 2 (two) times daily.    Historical Provider, MD  ipratropium-albuterol (DUONEB) 0.5-2.5 (3) MG/3ML SOLN Take 3 mLs by nebulization.    Historical Provider, MD  LORazepam (ATIVAN) 0.5 MG tablet Take 0.5 mg by mouth every 8 (eight) hours.      Historical Provider, MD  omeprazole (PRILOSEC) 10 MG capsule Take 10 mg by mouth daily.      Historical Provider, MD  oxyCODONE-acetaminophen (PERCOCET/ROXICET) 5-325 MG per tablet Take 1 tablet by mouth every 6 (six) hours as needed for moderate pain or severe pain. 02/18/14   Teressa LowerVrinda Tynisha Ogan, NP  tiotropium (SPIRIVA) 18 MCG inhalation capsule Place 18 mcg into inhaler and inhale daily.    Historical Provider, MD  valACYclovir (VALTREX) 1000 MG tablet Take 1 tablet (1,000 mg total) by mouth 3 (three) times daily. 02/18/14 03/04/14  Teressa LowerVrinda Andrell Tallman, NP   BP 115/78  Pulse 90  Temp(Src) 98.2 F (36.8 C) (Oral)  Resp 18  Ht 5\' 3"  (  1.6 m)  Wt 135 lb (61.236 kg)  BMI 23.92 kg/m2  SpO2 95% Physical Exam  Nursing note and vitals reviewed. Constitutional: She is oriented to person, place, and time. She appears well-developed and well-nourished.  Cardiovascular: Normal rate and regular rhythm.   Pulmonary/Chest: Effort normal and breath sounds normal.  Musculoskeletal: Normal range of motion.  Neurological: She is oriented to person, place, and time.  Skin:  Vesicular rash over and erythematous base. No drainage noted:rash noted to the right lower abdomen tracking towards the flank    ED Course  Procedures (including critical care time) Labs Review Labs Reviewed - No data to display  Imaging Review No  results found.   EKG Interpretation None      MDM   Final diagnoses:  Shingles    Consistent with shingles. Will treat appropriately. No facial involvement. Discussed neuropathic pain with pt    Teressa LowerVrinda Dawnette Mione, NP 02/18/14 1847

## 2014-02-18 NOTE — ED Notes (Signed)
Blistery rash on her right flank for a couple of days.

## 2014-02-18 NOTE — Discharge Instructions (Signed)
Shingles °Shingles (herpes zoster) is an infection that is caused by the same virus that causes chickenpox (varicella). The infection causes a painful skin rash and fluid-filled blisters, which eventually break open, crust over, and heal. It may occur in any area of the body, but it usually affects only one side of the body or face. The pain of shingles usually lasts about 1 month. However, some people with shingles may develop long-term (chronic) pain in the affected area of the body. °Shingles often occurs many years after the person had chickenpox. It is more common: °· In people older than 50 years. °· In people with weakened immune systems, such as those with HIV, AIDS, or cancer. °· In people taking medicines that weaken the immune system, such as transplant medicines. °· In people under great stress. °CAUSES  °Shingles is caused by the varicella zoster virus (VZV), which also causes chickenpox. After a person is infected with the virus, it can remain in the person's body for years in an inactive state (dormant). To cause shingles, the virus reactivates and breaks out as an infection in a nerve root. °The virus can be spread from person to person (contagious) through contact with open blisters of the shingles rash. It will only spread to people who have not had chickenpox. When these people are exposed to the virus, they may develop chickenpox. They will not develop shingles. Once the blisters scab over, the person is no longer contagious and cannot spread the virus to others. °SYMPTOMS  °Shingles shows up in stages. The initial symptoms may be pain, itching, and tingling in an area of the skin. This pain is usually described as burning, stabbing, or throbbing. In a few days or weeks, a painful red rash will appear in the area where the pain, itching, and tingling were felt. The rash is usually on one side of the body in a band or belt-like pattern. Then, the rash usually turns into fluid-filled blisters. They  will scab over and dry up in approximately 2-3 weeks. °Flu-like symptoms may also occur with the initial symptoms, the rash, or the blisters. These may include: °· Fever. °· Chills. °· Headache. °· Upset stomach. °DIAGNOSIS  °Your caregiver will perform a skin exam to diagnose shingles. Skin scrapings or fluid samples may also be taken from the blisters. This sample will be examined under a microscope or sent to a lab for further testing. °TREATMENT  °There is no specific cure for shingles. Your caregiver will likely prescribe medicines to help you manage the pain, recover faster, and avoid long-term problems. This may include antiviral drugs, anti-inflammatory drugs, and pain medicines. °HOME CARE INSTRUCTIONS  °· Take a cool bath or apply cool compresses to the area of the rash or blisters as directed. This may help with the pain and itching.   °· Only take over-the-counter or prescription medicines as directed by your caregiver.   °· Rest as directed by your caregiver. °· Keep your rash and blisters clean with mild soap and cool water or as directed by your caregiver.  °· Do not pick your blisters or scratch your rash. Apply an anti-itch cream or numbing creams to the affected area as directed by your caregiver. °· Keep your shingles rash covered with a loose bandage (dressing). °· Avoid skin contact with: °¨ Babies.   °¨ Pregnant women.   °¨ Children with eczema.   °¨ Elderly people with transplants.   °¨ People with chronic illnesses, such as leukemia or AIDS.   °· Wear loose-fitting clothing to help ease the   pain of material rubbing against the rash. °· Keep all follow-up appointments with your caregiver. If the area involved is on your face, you may receive a referral for follow-up to a specialist, such as an eye doctor (ophthalmologist) or an ear, nose, and throat (ENT) doctor. Keeping all follow-up appointments will help you avoid eye complications, chronic pain, or disability.   °SEEK IMMEDIATE MEDICAL  CARE IF:  °· You have facial pain, pain around the eye area, or loss of feeling on one side of your face. °· You have ear pain or ringing in your ear. °· You have loss of taste. °· Your pain is not relieved with prescribed medicines.   °· Your redness or swelling spreads.   °· You have more pain and swelling.  °· Your condition is worsening or has changed.   °· You have a fever or persistent symptoms for more than 2-3 days. °· You have a fever and your symptoms suddenly get worse. °MAKE SURE YOU: °· Understand these instructions. °· Will watch your condition. °· Will get help right away if you are not doing well or get worse. °Document Released: 08/06/2005 Document Revised: 04/30/2012 Document Reviewed: 03/20/2012 °[employer]® Patient Information ©2015 [employer]. This information is not intended to replace advice given to you by your health care provider. Make sure you discuss any questions you have with your health care provider. ° °

## 2014-02-19 NOTE — ED Provider Notes (Signed)
Medical screening examination/treatment/procedure(s) were performed by non-physician practitioner and as supervising physician I was immediately available for consultation/collaboration.   EKG Interpretation None        Candyce ChurnJohn David Jonanthony Nahar III, MD 02/19/14 [phone]

## 2017-07-27 ENCOUNTER — Emergency Department (HOSPITAL_BASED_OUTPATIENT_CLINIC_OR_DEPARTMENT_OTHER): Payer: Medicare Other

## 2017-07-27 ENCOUNTER — Encounter (HOSPITAL_BASED_OUTPATIENT_CLINIC_OR_DEPARTMENT_OTHER): Payer: Self-pay | Admitting: Emergency Medicine

## 2017-07-27 ENCOUNTER — Emergency Department (HOSPITAL_BASED_OUTPATIENT_CLINIC_OR_DEPARTMENT_OTHER)
Admission: EM | Admit: 2017-07-27 | Discharge: 2017-07-27 | Disposition: A | Discharge status: Home / Self Care | Payer: Medicare Other | Attending: Physician Assistant | Admitting: Physician Assistant

## 2017-07-27 ENCOUNTER — Other Ambulatory Visit: Payer: Self-pay

## 2017-07-27 DIAGNOSIS — J441 Chronic obstructive pulmonary disease with (acute) exacerbation: Secondary | ICD-10-CM | POA: Insufficient documentation

## 2017-07-27 DIAGNOSIS — R05 Cough: Secondary | ICD-10-CM | POA: Insufficient documentation

## 2017-07-27 DIAGNOSIS — Z79899 Other long term (current) drug therapy: Secondary | ICD-10-CM | POA: Insufficient documentation

## 2017-07-27 DIAGNOSIS — F17210 Nicotine dependence, cigarettes, uncomplicated: Secondary | ICD-10-CM | POA: Diagnosis not present

## 2017-07-27 DIAGNOSIS — R0602 Shortness of breath: Secondary | ICD-10-CM | POA: Diagnosis present

## 2017-07-27 MED ORDER — IPRATROPIUM-ALBUTEROL 0.5-2.5 (3) MG/3ML IN SOLN
3.0000 mL | Freq: Once | RESPIRATORY_TRACT | Status: AC
  Administered 2017-07-27: 3 mL via RESPIRATORY_TRACT
  Filled 2017-07-27: qty 3

## 2017-07-27 MED ORDER — DOXYCYCLINE HYCLATE 100 MG PO TABS
100.0000 mg | ORAL_TABLET | Freq: Once | ORAL | Status: AC
  Administered 2017-07-27: 100 mg via ORAL
  Filled 2017-07-27: qty 1

## 2017-07-27 MED ORDER — ALBUTEROL SULFATE (2.5 MG/3ML) 0.083% IN NEBU
2.5000 mg | INHALATION_SOLUTION | Freq: Once | RESPIRATORY_TRACT | Status: AC
  Administered 2017-07-27: 2.5 mg via RESPIRATORY_TRACT
  Filled 2017-07-27: qty 3

## 2017-07-27 MED ORDER — DOXYCYCLINE HYCLATE 100 MG PO TABS: 100.0000 mg | ORAL_TABLET | Freq: Once | ORAL | Status: DC

## 2017-07-27 MED ORDER — PREDNISONE 20 MG PO TABS
40.0000 mg | ORAL_TABLET | Freq: Once | ORAL | Status: AC
  Administered 2017-07-27: 40 mg via ORAL
  Filled 2017-07-27: qty 2

## 2017-07-27 MED ORDER — PREDNISONE 20 MG PO TABS: ORAL_TABLET | ORAL | 0 refills | Status: DC

## 2017-07-27 MED ORDER — DOXYCYCLINE HYCLATE 100 MG PO CAPS: 100.0000 mg | ORAL_CAPSULE | Freq: Two times a day (BID) | ORAL | 0 refills | Status: DC

## 2017-07-27 NOTE — ED Provider Notes (Signed)
[facility] EMERGENCY DEPARTMENT Provider Note   CSN: [id number] Arrival date & time: 07/27/17  1213     History   Chief Complaint Chief Complaint  Patient presents with  . Cough  . Shortness of Breath    HPI Erin Miranda is a 66 y.o. female.  HPI   66 year old female presenting with shortness of breath and cough.  Patient is on recent prednisone taper that was 10 mg for 12 days.  She stopped this a couple days ago that is gotten worse in terms of cough, increased sputum production.  No fevers.  No leg swelling.  No other somatic complaints.  Patient called her pulmonologist who would not give her antibiotics over the phone and told her to come here to the emergency department.  Past Medical History:  Diagnosis Date  . COPD (chronic obstructive pulmonary disease) (HCC)   . Depression   . GERD (gastroesophageal reflux disease)   . Hypercholesteremia     There are no active problems to display for this patient.   Past Surgical History:  Procedure Laterality Date  . ABDOMINAL HYSTERECTOMY    . CHOLECYSTECTOMY      OB History    No data available       Home Medications    Prior to Admission medications   Medication Sig Start Date End Date Taking? Authorizing Provider  albuterol (PROVENTIL HFA;VENTOLIN HFA) 108 (90 BASE) MCG/ACT inhaler Inhale 1-2 puffs into the lungs every 6 (six) hours as needed for wheezing or shortness of breath.   Yes [provider]  ezetimibe-simvastatin (VYTORIN) 10-40 MG per tablet Take 1 tablet by mouth at bedtime.     Yes [provider]  FLUoxetine (PROZAC) 20 MG capsule Take 20 mg by mouth daily.     Yes [provider]  Fluticasone-Salmeterol (ADVAIR) 100-50 MCG/DOSE AEPB Inhale 1 puff into the lungs 2 (two) times daily.   Yes [provider]  ipratropium-albuterol (DUONEB) 0.5-2.5 (3) MG/3ML SOLN Take 3 mLs by nebulization.   Yes [provider]  LORazepam (ATIVAN) 0.5 MG  tablet Take 0.5 mg by mouth every 8 (eight) hours.     Yes [provider]  omeprazole (PRILOSEC) 10 MG capsule Take 10 mg by mouth daily.     Yes [provider]  tiotropium (SPIRIVA) 18 MCG inhalation capsule Place 18 mcg into inhaler and inhale daily.   Yes [provider]  Estradiol (ESTROGEL) 0.75 MG/1.25 GM (0.06%) topical gel Place 1.25 g onto the skin daily.      [provider]  oxyCODONE-acetaminophen (PERCOCET/ROXICET) 5-325 MG per tablet Take 1 tablet by mouth every 6 (six) hours as needed for moderate pain or severe pain. 02/18/14   Teressa LowerPickering, Vrinda, NP    Family History No family history on file.  Social History Social History   Tobacco Use  . Smoking status: Current Every Day Smoker    Packs/day: 0.50    Types: Cigarettes  . Smokeless tobacco: Never Used  Substance Use Topics  . Alcohol use: Yes    Comment: occasional  . Drug use: No     Allergies   Chantix [varenicline] and Erythromycin   Review of Systems Review of Systems  Constitutional: Negative for activity change and fever.  HENT: Negative for congestion.   Respiratory: Positive for cough and shortness of breath.   Cardiovascular: Negative for chest pain.  Gastrointestinal: Negative for abdominal pain.  All other systems reviewed and are negative.    Physical  Exam Updated Vital Signs BP 101/74   Pulse 85   Temp 98.4 F (36.9 C) (Oral)   Resp 16   Ht 5\' 2"  (1.575 m)   Wt 56.7 kg (125 lb)   SpO2 92%   BMI 22.86 kg/m   Physical Exam  Constitutional: She is oriented to person, place, and time. She appears well-developed and well-nourished.  HENT:  Head: Normocephalic and atraumatic.  Eyes: Right eye exhibits no discharge.  Neck: Normal range of motion. Neck supple.  Cardiovascular: Normal heart sounds.  No murmur heard. tachycardias  Pulmonary/Chest: Tachypnea noted. No respiratory distress. She has wheezes. She has rhonchi. She has no rales.    Abdominal: Soft. She exhibits no distension. There is no tenderness.  Neurological: She is oriented to person, place, and time.  Skin: Skin is warm and dry. She is not diaphoretic.  Psychiatric: She has a normal mood and affect.  Nursing note and vitals reviewed.    ED Treatments / Results  Labs (all labs ordered are listed, but only abnormal results are displayed) Labs Reviewed - No data to display  EKG  EKG Interpretation  Date/Time:  Saturday July 27 2017 12:44:15 EST Ventricular Rate:  95 PR Interval:    QRS Duration: 78 QT Interval:  334 QTC Calculation: 420 R Axis:   82 Text Interpretation:  Sinus rhythm Anteroseptal infarct, age indeterminate Baseline wander in lead(s) I III aVL V3 V6 Normal sinus rhythm Confirmed by Bary CastillaMackuen, Courteney ([id number]) on 07/27/2017 3:10:50 PM       Radiology Dg Chest 2 View  Result Date: 07/27/2017 CLINICAL DATA:  66 year old female with productive cough and shortness of breath on exertion. EXAM: CHEST  2 VIEW COMPARISON:  06/27/2013 FINDINGS: The heart size and mediastinal contours are within normal limits. Mild bibasilar atelectasis. The lungs are otherwise clear without focal parenchymal opacity, pleural effusion or pneumothorax. The visualized skeletal structures are unremarkable. IMPRESSION: No active cardiopulmonary disease. Electronically Signed   By: Sande BrothersSerena  Chacko M.D.   On: 07/27/2017 12:48    Procedures Procedures (including critical care time)  Medications Ordered in ED Medications  doxycycline (VIBRA-TABS) tablet 100 mg (not administered)  ipratropium-albuterol (DUONEB) 0.5-2.5 (3) MG/3ML nebulizer solution 3 mL (3 mLs Nebulization Given 07/27/17 1246)  albuterol (PROVENTIL) (2.5 MG/3ML) 0.083% nebulizer solution 2.5 mg (2.5 mg Nebulization Given 07/27/17 1246)  predniSONE (DELTASONE) tablet 40 mg (40 mg Oral Given 07/27/17 1417)     Initial Impression / Assessment and Plan / ED Course  I have reviewed the triage vital  signs and the nursing notes.  Pertinent labs & imaging results that were available during my care of the patient were reviewed by me and considered in my medical decision making (see chart for details).     66 year old female presenting with shortness of breath and cough.  Patient is on recent prednisone taper that was 10 mg for 12 days.  She stopped this a couple days ago that is gotten worse in terms of cough, increased sputum production.  No fevers.  No leg swelling.  No other somatic complaints.  Patient called her pulmonologist who would not give her antibiotics over the phone and told her to come here to the emergency department.  2:10 PM Chest x-ray shows no evidence of pneumonia.  Will treat with a steroid burst.  Duo nebs.  Patient is not hypoxic.  Patient has home oxygen at night.  3:09 PM Patient improved completely.  Good breath sounds, not on oxygen, normal heart rate.  Will give doxycycline for treatment of community-acquired pneumonia.  Return precautions expressed.  Patient also concerned that she may be developing thrush of her mouth so we will give her swish and swallow nystatin.  Final Clinical Impressions(s) / ED Diagnoses   Final diagnoses:  None    ED Discharge Orders    None       Abelino Derrick, MD 07/27/17 1516

## 2017-07-27 NOTE — ED Notes (Signed)
ED Provider at bedside. 

## 2017-07-27 NOTE — ED Notes (Signed)
Patient taken off O2  - no distress at this time

## 2017-07-27 NOTE — ED Triage Notes (Signed)
Cough since yesterday with SOB with exertion. Pt with hx of COPD and O2 dependent at night.

## 2017-07-27 NOTE — ED Notes (Signed)
Pt returned from xray. Pt insisted on walking to xray.

## 2017-07-27 NOTE — Discharge Instructions (Signed)
Return with increasing shortness of breath, or other concerns.

## 2018-02-22 ENCOUNTER — Encounter (HOSPITAL_BASED_OUTPATIENT_CLINIC_OR_DEPARTMENT_OTHER): Payer: Self-pay | Admitting: Emergency Medicine

## 2018-02-22 ENCOUNTER — Other Ambulatory Visit: Payer: Self-pay

## 2018-02-22 ENCOUNTER — Emergency Department (HOSPITAL_BASED_OUTPATIENT_CLINIC_OR_DEPARTMENT_OTHER): Payer: Medicare Other

## 2018-02-22 ENCOUNTER — Emergency Department (HOSPITAL_BASED_OUTPATIENT_CLINIC_OR_DEPARTMENT_OTHER)
Admission: EM | Admit: 2018-02-22 | Discharge: 2018-02-22 | Disposition: A | Discharge status: Home / Self Care | Payer: Medicare Other | Attending: Emergency Medicine | Admitting: Emergency Medicine

## 2018-02-22 DIAGNOSIS — R0602 Shortness of breath: Secondary | ICD-10-CM | POA: Diagnosis present

## 2018-02-22 DIAGNOSIS — F17210 Nicotine dependence, cigarettes, uncomplicated: Secondary | ICD-10-CM | POA: Insufficient documentation

## 2018-02-22 DIAGNOSIS — J441 Chronic obstructive pulmonary disease with (acute) exacerbation: Secondary | ICD-10-CM

## 2018-02-22 DIAGNOSIS — Z79899 Other long term (current) drug therapy: Secondary | ICD-10-CM | POA: Diagnosis not present

## 2018-02-22 LAB — BASIC METABOLIC PANEL
Anion gap: 7 (ref 5–15)
BUN: 15 mg/dL (ref 8–23)
CO2: 32 mmol/L (ref 22–32)
Calcium: 9.4 mg/dL (ref 8.9–10.3)
Chloride: 99 mmol/L (ref 98–111)
Creatinine, Ser: 0.67 mg/dL (ref 0.44–1.00)
GFR calc Af Amer: >60 mL/min (ref >60)
GFR calc non Af Amer: >60 mL/min (ref >60)
Glucose, Bld: 80 mg/dL (ref 70–99)
Potassium: 3.7 mmol/L (ref 3.5–5.1)
Sodium: 138 mmol/L (ref 135–145)

## 2018-02-22 LAB — CBC WITH DIFFERENTIAL/PLATELET
Basophils Absolute: 0 10*3/uL (ref 0.0–0.1)
Basophils Relative: 0 %
EOS PCT: 3 %
Eosinophils Absolute: 0.4 10*3/uL (ref 0.0–0.7)
HCT: 46.8 % — ABNORMAL HIGH (ref 36.0–46.0)
HEMOGLOBIN: 15.3 g/dL — AB (ref 12.0–15.0)
LYMPHS ABS: 3.7 10*3/uL (ref 0.7–4.0)
LYMPHS PCT: 29 %
MCH: 30.7 pg (ref 26.0–34.0)
MCHC: 32.7 g/dL (ref 30.0–36.0)
MCV: 93.8 fL (ref 78.0–100.0)
Monocytes Absolute: 1.2 10*3/uL — ABNORMAL HIGH (ref 0.1–1.0)
Monocytes Relative: 10 %
NEUTROS PCT: 58 %
Neutro Abs: 7.5 10*3/uL (ref 1.7–7.7)
PLATELETS: 372 10*3/uL (ref 150–400)
RBC: 4.99 MIL/uL (ref 3.87–5.11)
RDW: 14.8 % (ref 11.5–15.5)
WBC: 12.9 10*3/uL — AB (ref 4.0–10.5)

## 2018-02-22 MED ORDER — PREDNISONE 50 MG PO TABS
60.0000 mg | ORAL_TABLET | Freq: Once | ORAL | Status: AC
  Administered 2018-02-22: 60 mg via ORAL
  Filled 2018-02-22: qty 1

## 2018-02-22 MED ORDER — IPRATROPIUM-ALBUTEROL 0.5-2.5 (3) MG/3ML IN SOLN
3.0000 mL | Freq: Once | RESPIRATORY_TRACT | Status: AC
  Administered 2018-02-22: 3 mL via RESPIRATORY_TRACT
  Filled 2018-02-22: qty 3

## 2018-02-22 MED ORDER — ALBUTEROL SULFATE (2.5 MG/3ML) 0.083% IN NEBU
2.5000 mg | INHALATION_SOLUTION | Freq: Once | RESPIRATORY_TRACT | Status: AC
  Administered 2018-02-22: 2.5 mg via RESPIRATORY_TRACT
  Filled 2018-02-22: qty 3

## 2018-02-22 MED ORDER — IOPAMIDOL (ISOVUE-300) INJECTION 61%
100.0000 mL | Freq: Once | INTRAVENOUS | Status: AC | PRN
  Administered 2018-02-22: 80 mL via INTRAVENOUS

## 2018-02-22 MED ORDER — PREDNISONE 10 MG PO TABS: 40.0000 mg | ORAL_TABLET | Freq: Every day | ORAL | 0 refills | Status: AC

## 2018-02-22 NOTE — ED Notes (Signed)
Patient ambulated with 2 l/m [place].  SpO2 92-96%, HR 100-106, denies increased WOB/DOE.

## 2018-02-22 NOTE — ED Provider Notes (Signed)
[facility] EMERGENCY DEPARTMENT Provider Note   CSN: [id number] Arrival date & time: 02/22/18  4098     History   Chief Complaint Chief Complaint  Patient presents with  . Shortness of Breath    HPI Erin Miranda is a 67 y.o. female presenting for 3-day history of shortness of breath.  Patient states that she becomes dyspneic on exertion, she normally is on 3 L of oxygen per nasal cannula at night to sleep however she is found that she needs to use O2 in the morning due to shortness of breath and she states that her home SPO2 monitor states that she is dropping into the high 80s in the mornings. Associated symptoms patient states that she has had nasal congestion for the past week. Current everyday smoker. Patient's last admission was on 02/08/2018. Patient denies history of fever, chest pain, abdominal pain, headache, urinary or bowel changes. Patient states she is on 5mg  of prednisone daily. HPI  Past Medical History:  Diagnosis Date  . COPD (chronic obstructive pulmonary disease) (HCC)   . Depression   . GERD (gastroesophageal reflux disease)   . Hypercholesteremia     There are no active problems to display for this patient.   Past Surgical History:  Procedure Laterality Date  . ABDOMINAL HYSTERECTOMY    . CHOLECYSTECTOMY       OB History   None      Home Medications    Prior to Admission medications   Medication Sig Start Date End Date Taking? Authorizing Provider  albuterol (PROVENTIL HFA;VENTOLIN HFA) 108 (90 BASE) MCG/ACT inhaler Inhale 1-2 puffs into the lungs every 6 (six) hours as needed for wheezing or shortness of breath.    [provider]  doxycycline (VIBRAMYCIN) 100 MG capsule Take 1 capsule (100 mg total) by mouth 2 (two) times daily. 07/27/17   Mackuen, Courteney Lyn, MD  Estradiol (ESTROGEL) 0.75 MG/1.25 GM (0.06%) topical gel Place 1.25 g onto the skin daily.      [provider]  ezetimibe-simvastatin (VYTORIN)  10-40 MG per tablet Take 1 tablet by mouth at bedtime.      [provider]  FLUoxetine (PROZAC) 20 MG capsule Take 20 mg by mouth daily.      [provider]  Fluticasone-Salmeterol (ADVAIR) 100-50 MCG/DOSE AEPB Inhale 1 puff into the lungs 2 (two) times daily.    [provider]  ipratropium-albuterol (DUONEB) 0.5-2.5 (3) MG/3ML SOLN Take 3 mLs by nebulization.    [provider]  LORazepam (ATIVAN) 0.5 MG tablet Take 0.5 mg by mouth every 8 (eight) hours.      [provider]  omeprazole (PRILOSEC) 10 MG capsule Take 10 mg by mouth daily.      [provider]  oxyCODONE-acetaminophen (PERCOCET/ROXICET) 5-325 MG per tablet Take 1 tablet by mouth every 6 (six) hours as needed for moderate pain or severe pain. 02/18/14   Teressa Lower, NP  predniSONE (DELTASONE) 10 MG tablet Take 4 tablets (40 mg total) by mouth daily for 5 days. 02/23/18 02/28/18  Harlene Salts A, PA-C  tiotropium (SPIRIVA) 18 MCG inhalation capsule Place 18 mcg into inhaler and inhale daily.    [provider]    Family History No family history on file.  Social History Social History   Tobacco Use  . Smoking status: Current Every Day Smoker    Packs/day: 0.50    Types: Cigarettes  . Smokeless tobacco: Never Used  Substance Use Topics  . Alcohol  use: Yes    Comment: occasional  . Drug use: No     Allergies   Chantix [varenicline] and Erythromycin   Review of Systems Review of Systems  Constitutional: Negative.  Negative for chills, fatigue and fever.  HENT: Positive for congestion. Negative for ear pain, hearing loss, rhinorrhea, sinus pressure, sore throat, trouble swallowing and voice change.   Eyes: Negative.  Negative for visual disturbance.  Respiratory: Positive for shortness of breath. Negative for cough and chest tightness.   Cardiovascular: Negative.  Negative for chest pain and leg swelling.  Gastrointestinal: Negative.  Negative for  abdominal pain, blood in stool, diarrhea, nausea and vomiting.  Genitourinary: Negative.  Negative for difficulty urinating, dysuria, flank pain, hematuria and pelvic pain.  Musculoskeletal: Negative.  Negative for arthralgias, myalgias and neck pain.  Skin: Negative.  Negative for rash.  Neurological: Negative.  Negative for dizziness, syncope, weakness, light-headedness and headaches.     Physical Exam Updated Vital Signs BP 109/78 (BP Location: Left Arm)   Pulse 95   Temp 98.4 F (36.9 C) (Oral)   Resp 20   Ht 5\' 2"  (1.575 m)   Wt 56.2 kg (124 lb)   SpO2 92%   BMI 22.68 kg/m   Physical Exam  Constitutional: She is oriented to person, place, and time. She appears well-developed and well-nourished.  Non-toxic appearance. She does not appear ill. No distress.  HENT:  Head: Normocephalic and atraumatic.  Mouth/Throat: Oropharynx is clear and moist. No oropharyngeal exudate or posterior oropharyngeal edema.  Eyes: Pupils are equal, round, and reactive to light. EOM are normal.  Neck: Normal range of motion. Neck supple. No JVD present. No tracheal deviation present.  Cardiovascular: Normal rate, regular rhythm, normal heart sounds and intact distal pulses. Exam reveals no gallop and no friction rub.  No murmur heard. Pulmonary/Chest: Effort normal. No respiratory distress. She has no decreased breath sounds. She has wheezes. She has no rhonchi. She exhibits no tenderness and no deformity.  Patient with diffuse expiratory wheezing.  Abdominal: Soft. Bowel sounds are normal. There is no tenderness. There is no rebound and no guarding.  Musculoskeletal: Normal range of motion.       Right lower leg: Normal. She exhibits no edema.       Left lower leg: Normal. She exhibits no edema.  Lymphadenopathy:    She has no cervical adenopathy.  Neurological: She is alert and oriented to person, place, and time.  Skin: Skin is warm and dry. Capillary refill takes less than 2 seconds.    Psychiatric: She has a normal mood and affect. Her behavior is normal.     ED Treatments / Results  Labs (all labs ordered are listed, but only abnormal results are displayed) Labs Reviewed  CBC WITH DIFFERENTIAL/PLATELET - Abnormal; Notable for the following components:      Result Value   WBC 12.9 (*)    Hemoglobin 15.3 (*)    HCT 46.8 (*)    Monocytes Absolute 1.2 (*)    All other components within normal limits  BASIC METABOLIC PANEL    EKG EKG Interpretation  Date/Time:  Saturday February 22 2018 09:33:43 EDT Ventricular Rate:  88 PR Interval:    QRS Duration: 86 QT Interval:  346 QTC Calculation: 419 R Axis:   76 Text Interpretation:  Sinus rhythm Short PR interval Low voltage, precordial leads No significant change since last tracing Confirmed by Vanetta Mulders [phone] on 02/22/2018 9:54:34 AM   Radiology Dg Chest 2 View  Result Date: 02/22/2018 CLINICAL DATA:  Dyspnea and cough EXAM: CHEST - 2 VIEW COMPARISON:  07/27/2017 chest radiograph. FINDINGS: Stable cardiomediastinal silhouette with normal heart size. No pneumothorax. No pleural effusion. No pulmonary edema. Mild hazy opacity in the lingula silhouetting the left heart border. IMPRESSION: Mild hazy lingular opacity, which may represent a developing pneumonia. Recommend follow-up PA and lateral post treatment chest radiographs in 4-6 weeks. Electronically Signed   By: Delbert Phenix M.D.   On: 02/22/2018 09:59   Ct Chest W Contrast  Result Date: 02/22/2018 CLINICAL DATA:  Worsening dyspnea. COPD without excision use at night. Abnormal chest radiograph. EXAM: CT CHEST WITH CONTRAST TECHNIQUE: Multidetector CT imaging of the chest was performed during intravenous contrast administration. CONTRAST:  80mL ISOVUE-300 IOPAMIDOL (ISOVUE-300) INJECTION 61% COMPARISON:  Chest radiograph from earlier today. FINDINGS: Cardiovascular: Normal heart size. Small pericardial effusion/thickening anteriorly. Atherosclerotic nonaneurysmal  thoracic aorta. Normal caliber pulmonary arteries. No central pulmonary emboli. Mediastinum/Nodes: No discrete thyroid nodules. Unremarkable esophagus. No pathologically enlarged axillary, mediastinal or hilar lymph nodes. Lungs/Pleura: No pneumothorax. No pleural effusion. Moderate centrilobular emphysema with diffuse bronchial wall thickening. No acute consolidative airspace disease, lung masses or significant pulmonary nodules. Multiple small parenchymal bands are noted in the mid to lower lungs bilaterally, most prominent in medial lingula. Upper abdomen: Subcentimeter hypodense left liver dome lesion, too small to characterize, which requires no follow-up unless the patient has risk factors for liver malignancy. Subcentimeter hypodense upper left renal cortical lesion, too small to characterize, which requires no follow-up. Musculoskeletal: No aggressive appearing focal osseous lesions. Moderate thoracic spondylosis. IMPRESSION: 1. Multiple small parenchymal bands in the mid to lower lungs bilaterally, most prominent in the medial lingula, compatible with nonspecific postinfectious/postinflammatory scarring. No acute consolidative airspace disease to suggest a pneumonia. 2. Moderate centrilobular emphysema with diffuse bronchial wall thickening, compatible with the provided history of COPD. 3. Small pericardial effusion/thickening. Aortic Atherosclerosis (ICD10-I70.0) and Emphysema (ICD10-J43.9). Electronically Signed   By: Delbert Phenix M.D.   On: 02/22/2018 12:26    Procedures Procedures (including critical care time)  Medications Ordered in ED Medications  ipratropium-albuterol (DUONEB) 0.5-2.5 (3) MG/3ML nebulizer solution 3 mL (3 mLs Nebulization Given 02/22/18 0949)  albuterol (PROVENTIL) (2.5 MG/3ML) 0.083% nebulizer solution 2.5 mg (2.5 mg Nebulization Given 02/22/18 0949)  ipratropium-albuterol (DUONEB) 0.5-2.5 (3) MG/3ML nebulizer solution 3 mL (3 mLs Nebulization Given 02/22/18 1144)  iopamidol  (ISOVUE-300) 61 % injection 100 mL (80 mLs Intravenous Contrast Given 02/22/18 1208)  predniSONE (DELTASONE) tablet 60 mg (60 mg Oral Given 02/22/18 1253)     Initial Impression / Assessment and Plan / ED Course  I have reviewed the triage vital signs and the nursing notes.  Pertinent labs & imaging results that were available during my care of the patient were reviewed by me and considered in my medical decision making (see chart for details).   Patient presenting with COPD exacerbation, CT negative for pneumonia.  Patient treated in department with DuoNeb x2 as well as 60 mill grams of prednisone.  On reassessment patient's expiratory wheezing has decreased.  She states that she is feeling much better and is no longer dyspneic.  Patient ambulated around department on pulse oximetry, on 2 L of oxygen patient's saturations stayed above 92% and she ambulated well.  Patient denies shortness of breath, dyspnea on exertion, dizziness, lightheadedness, headache or chest pain.  Patient is to be discharged with 40 mg of prednisone for the next 5 days and is to use her supplemental oxygen at  all times until she has follow-up with her primary care provider.  Patient is agreeable to plan and states she is ready for discharge.  Patient has been encouraged to quit smoking.  At this time there does not appear to be any evidence of an acute emergency medical condition and the patient appears stable for discharge with appropriate outpatient follow up. Diagnosis was discussed with patient who verbalizes understanding and is agreeable to discharge. I have discussed return precautions with patient and family who verbalize understanding of return precautions. Patient strongly encouraged to follow-up with their PCP.  Patient's case discussed with Dr. Deretha EmoryZackowski who agrees with plan to discharge with follow-up.     This note was dictated using DragonOne dictation software; please contact for any inconsistencies within the  note.    Final Clinical Impressions(s) / ED Diagnoses   Final diagnoses:  COPD exacerbation [facility](HCC)    ED Discharge Orders        Ordered    predniSONE (DELTASONE) 10 MG tablet  Daily     02/22/18 1440       Elizabeth PalauMorelli, Louann Hopson A, PA-C 02/22/18 1958    Vanetta MuldersZackowski, Scott, MD 02/26/18 [phone]

## 2018-02-22 NOTE — ED Notes (Signed)
Pt ambulate w/o assist on room air with pulse Ox. O2 ranged from 89% to 94%

## 2018-02-22 NOTE — ED Triage Notes (Signed)
SOB, cough, congestion x 3 days. Hx of COPD. Wears O2 at night.

## 2018-02-22 NOTE — Discharge Instructions (Signed)
Please use your supplemental home oxygen throughout the day and nighttime until you follow-up with your primary care provider. Please take prednisone as prescribed starting tomorrow, 02/23/2018. Please follow-up with your primary care provider regarding your visit today. Please return to the emergency department for any new or worsening symptoms.  Contact a health care provider if: You are coughing up more mucus than usual. There is a change in the color or thickness of your mucus. Your breathing is more labored than usual. Your breathing is faster than usual. You have difficulty sleeping. You need to use your rescue medicines or inhalers more often than expected. You have trouble doing routine activities such as getting dressed or walking around the house. Get help right away if: You have shortness of breath while you are resting. You have shortness of breath that prevents you from: Being able to talk. Performing your usual physical activities. You have chest pain lasting longer than 5 minutes. Your skin color is more blue (cyanotic) than usual. You measure low oxygen saturations for longer than 5 minutes with a pulse oximeter. You have a fever. You feel too tired to breathe normally.

## 2018-02-22 NOTE — ED Notes (Signed)
Patient resting, denies SHOB, SpO2 88%, placed on 2l/m [place] O2.  SpO2 increased to 92%

## 2018-02-22 NOTE — ED Provider Notes (Signed)
Medical screening examination/treatment/procedure(s) were conducted as a shared visit with non-physician practitioner(s) and myself.  I personally evaluated the patient during the encounter.  EKG Interpretation  Date/Time:  Saturday February 22 2018 09:33:43 EDT Ventricular Rate:  88 PR Interval:    QRS Duration: 86 QT Interval:  346 QTC Calculation: 419 R Axis:   76 Text Interpretation:  Sinus rhythm Short PR interval Low voltage, precordial leads No significant change since last tracing Confirmed by Vanetta MuldersZackowski, Jovie Swanner [phone] on 02/22/2018 9:54:34 AM   Patient seen by me along with physician assistant.  Patient presenting with shortness of breath cough congestion for the past 3 days.  Patient has a history of COPD.  Does have oxygen at home but normally only wears it at night usually wears 3 L.  He usually does not require during the day.  Patient's had a little bit of a clear sputum.  But that is not unusual for her.  Patient's.  Taking prednisone 5 mg daily this is been a chronic dose for her.  She does have breathing treatments at home.  Chest x-ray here raise some concerns for left lingular pneumonia.  CT scan with contrast was done which ruled out pneumonia.  Patient was wheezing upon arrival received nebulizer treatment.  Still wheezing some after that received a second nebulizer treatment.  Now wheezing is very minimal patient moving air better and she feels as if she is improved.  We will give patient 60 mg of prednisone.  We will ambulate her with pulse ox on room air and see how her oxygen's to if her sats dropped and we will ambulate her with 2 L of oxygen which she can use at home.  We will discharge her home if she is able to go home with a 5-day course of prednisone 40 mg each day.  And follow-up with her primary care doctor.   Vanetta MuldersZackowski, Tully Mcinturff, MD 02/22/18 1254

## 2018-09-28 ENCOUNTER — Other Ambulatory Visit: Payer: Self-pay

## 2018-09-28 ENCOUNTER — Emergency Department (HOSPITAL_BASED_OUTPATIENT_CLINIC_OR_DEPARTMENT_OTHER): Payer: Medicare Other

## 2018-09-28 ENCOUNTER — Emergency Department (HOSPITAL_BASED_OUTPATIENT_CLINIC_OR_DEPARTMENT_OTHER)
Admission: EM | Admit: 2018-09-28 | Discharge: 2018-09-28 | Disposition: A | Discharge status: Home / Self Care | Payer: Medicare Other | Attending: Emergency Medicine | Admitting: Emergency Medicine

## 2018-09-28 ENCOUNTER — Encounter (HOSPITAL_BASED_OUTPATIENT_CLINIC_OR_DEPARTMENT_OTHER): Payer: Self-pay | Admitting: Emergency Medicine

## 2018-09-28 DIAGNOSIS — J441 Chronic obstructive pulmonary disease with (acute) exacerbation: Secondary | ICD-10-CM | POA: Diagnosis not present

## 2018-09-28 DIAGNOSIS — Z79899 Other long term (current) drug therapy: Secondary | ICD-10-CM | POA: Diagnosis not present

## 2018-09-28 DIAGNOSIS — F17210 Nicotine dependence, cigarettes, uncomplicated: Secondary | ICD-10-CM | POA: Insufficient documentation

## 2018-09-28 DIAGNOSIS — R0602 Shortness of breath: Secondary | ICD-10-CM | POA: Diagnosis present

## 2018-09-28 LAB — CBC WITH DIFFERENTIAL/PLATELET
ABS IMMATURE GRANULOCYTES: 0.06 10*3/uL (ref 0.00–0.07)
BASOS ABS: 0 10*3/uL (ref 0.0–0.1)
Basophils Relative: 0 %
Eosinophils Absolute: 0.3 10*3/uL (ref 0.0–0.5)
Eosinophils Relative: 2 %
HCT: 50.4 % — ABNORMAL HIGH (ref 36.0–46.0)
HEMOGLOBIN: 15.6 g/dL — AB (ref 12.0–15.0)
Immature Granulocytes: 1 %
Lymphocytes Relative: 28 %
Lymphs Abs: 3.5 10*3/uL (ref 0.7–4.0)
MCH: 29.2 pg (ref 26.0–34.0)
MCHC: 31 g/dL (ref 30.0–36.0)
MCV: 94.4 fL (ref 80.0–100.0)
Monocytes Absolute: 1 10*3/uL (ref 0.1–1.0)
Monocytes Relative: 8 %
NEUTROS ABS: 7.7 10*3/uL (ref 1.7–7.7)
NRBC: 0 % (ref 0.0–0.2)
Neutrophils Relative %: 61 %
Platelets: 329 10*3/uL (ref 150–400)
RBC: 5.34 MIL/uL — AB (ref 3.87–5.11)
RDW: 14.6 % (ref 11.5–15.5)
WBC: 12.6 10*3/uL — AB (ref 4.0–10.5)

## 2018-09-28 LAB — BASIC METABOLIC PANEL
Anion gap: 8 (ref 5–15)
BUN: 14 mg/dL (ref 8–23)
CO2: 30 mmol/L (ref 22–32)
Calcium: 8.8 mg/dL — ABNORMAL LOW (ref 8.9–10.3)
Chloride: 101 mmol/L (ref 98–111)
Creatinine, Ser: 0.6 mg/dL (ref 0.44–1.00)
GFR calc non Af Amer: >60 mL/min (ref >60)
Glucose, Bld: 108 mg/dL — ABNORMAL HIGH (ref 70–99)
POTASSIUM: 3.3 mmol/L — AB (ref 3.5–5.1)
SODIUM: 139 mmol/L (ref 135–145)

## 2018-09-28 MED ORDER — ALBUTEROL (5 MG/ML) CONTINUOUS INHALATION SOLN
10.0000 mg/h | INHALATION_SOLUTION | Freq: Once | RESPIRATORY_TRACT | Status: AC
  Administered 2018-09-28: 10 mg/h via RESPIRATORY_TRACT
  Filled 2018-09-28: qty 20

## 2018-09-28 MED ORDER — IPRATROPIUM BROMIDE 0.02 % IN SOLN
0.5000 mg | Freq: Once | RESPIRATORY_TRACT | Status: AC
  Administered 2018-09-28: 0.5 mg via RESPIRATORY_TRACT
  Filled 2018-09-28: qty 2.5

## 2018-09-28 MED ORDER — ALBUTEROL SULFATE (2.5 MG/3ML) 0.083% IN NEBU
INHALATION_SOLUTION | RESPIRATORY_TRACT | Status: AC
  Administered 2018-09-28: 2.5 mg
  Filled 2018-09-28: qty 3

## 2018-09-28 MED ORDER — PREDNISONE 10 MG PO TABS: 40.0000 mg | ORAL_TABLET | Freq: Every day | ORAL | 0 refills | Status: DC

## 2018-09-28 MED ORDER — DOXYCYCLINE HYCLATE 100 MG PO CAPS: 100.0000 mg | ORAL_CAPSULE | Freq: Two times a day (BID) | ORAL | 0 refills | Status: DC

## 2018-09-28 MED ORDER — METHYLPREDNISOLONE SODIUM SUCC 125 MG IJ SOLR
125.0000 mg | Freq: Once | INTRAMUSCULAR | Status: AC
  Administered 2018-09-28: 125 mg via INTRAVENOUS
  Filled 2018-09-28: qty 2

## 2018-09-28 MED ORDER — IPRATROPIUM-ALBUTEROL 0.5-2.5 (3) MG/3ML IN SOLN
RESPIRATORY_TRACT | Status: AC
  Administered 2018-09-28: 3 mL
  Filled 2018-09-28: qty 3

## 2018-09-28 NOTE — ED Triage Notes (Signed)
Cough and congestion x 1 week with worsening SOB today. Hx of COPD and wears O2 at night.

## 2018-09-28 NOTE — ED Notes (Signed)
Pt on hour long treatment- delay in pulse ox check

## 2018-09-28 NOTE — ED Notes (Signed)
ED Provider at bedside. 

## 2018-09-28 NOTE — Discharge Instructions (Signed)
We saw you in the ER for your breathing related complains. We gave you some breathing treatments in the ER, and seems like your symptoms have improved. °Please take albuterol as needed every 4 hours. °Please take the medications prescribed. °Please refrain from smoking or smoke exposure. °Please see a primary care doctor in 1 week. °Return to the ER if your symptoms worsen. ° °

## 2018-09-28 NOTE — ED Provider Notes (Signed)
[facility] EMERGENCY DEPARTMENT Provider Note   CSN: [id number] Arrival date & time: 09/28/18  1105     History   Chief Complaint Chief Complaint  Patient presents with  . Shortness of Breath    HPI Erin Miranda is a 68 y.o. female.  HPI  68 year old female comes in with chief complaint of shortness of breath.  Patient has history of COPD.  She states that over the past few days she has had exertional shortness of breath and now she is having increased wheezing.  Patient is on 3 L of oxygen at nighttime, however she has had to use oxygen during the daytime especially when she is walking.  Minimal exertion gets patient short of breath which is why she came to the ER.  Review of system is also positive for a new cough that is producing thick phlegm.  Patient denies any other flulike symptoms, fevers, chills  Pt has no hx of PE, DVT and denies any exogenous hormone (testosterone / estrogen) use, long distance travels or surgery in the past 6 weeks, active cancer, recent immobilization.   Past Medical History:  Diagnosis Date  . COPD (chronic obstructive pulmonary disease) (HCC)   . Depression   . GERD (gastroesophageal reflux disease)   . Hypercholesteremia     There are no active problems to display for this patient.   Past Surgical History:  Procedure Laterality Date  . ABDOMINAL HYSTERECTOMY    . CHOLECYSTECTOMY       OB History   No obstetric history on file.      Home Medications    Prior to Admission medications   Medication Sig Start Date End Date Taking? Authorizing Provider  albuterol (PROVENTIL HFA;VENTOLIN HFA) 108 (90 BASE) MCG/ACT inhaler Inhale 1-2 puffs into the lungs every 6 (six) hours as needed for wheezing or shortness of breath.    [provider]  doxycycline (VIBRAMYCIN) 100 MG capsule Take 1 capsule (100 mg total) by mouth 2 (two) times daily. 09/28/18   Derwood KaplanNanavati, Sahra Converse, MD  Estradiol (ESTROGEL) 0.75 MG/1.25 GM (0.06%)  topical gel Place 1.25 g onto the skin daily.      [provider]  ezetimibe-simvastatin (VYTORIN) 10-40 MG per tablet Take 1 tablet by mouth at bedtime.      [provider]  FLUoxetine (PROZAC) 20 MG capsule Take 20 mg by mouth daily.      [provider]  Fluticasone-Salmeterol (ADVAIR) 100-50 MCG/DOSE AEPB Inhale 1 puff into the lungs 2 (two) times daily.    [provider]  ipratropium-albuterol (DUONEB) 0.5-2.5 (3) MG/3ML SOLN Take 3 mLs by nebulization.    [provider]  LORazepam (ATIVAN) 0.5 MG tablet Take 0.5 mg by mouth every 8 (eight) hours.      [provider]  omeprazole (PRILOSEC) 10 MG capsule Take 10 mg by mouth daily.      [provider]  oxyCODONE-acetaminophen (PERCOCET/ROXICET) 5-325 MG per tablet Take 1 tablet by mouth every 6 (six) hours as needed for moderate pain or severe pain. 02/18/14   Teressa LowerPickering, Vrinda, NP  predniSONE (DELTASONE) 10 MG tablet Take 4 tablets (40 mg total) by mouth daily. 09/29/18   Derwood KaplanNanavati, Sharilyn Geisinger, MD  tiotropium (SPIRIVA) 18 MCG inhalation capsule Place 18 mcg into inhaler and inhale daily.    [provider]    Family History No family history on file.  Social History Social History   Tobacco Use  . Smoking status: Current Every Day Smoker  Packs/day: 0.50    Types: Cigarettes  . Smokeless tobacco: Never Used  Substance Use Topics  . Alcohol use: Yes    Comment: occasional  . Drug use: No     Allergies   Chantix [varenicline] and Erythromycin   Review of Systems Review of Systems  Constitutional: Positive for activity change.  Respiratory: Positive for shortness of breath and wheezing.   Cardiovascular: Negative for chest pain.  Allergic/Immunologic: Negative for immunocompromised state.  Neurological: Negative for dizziness.  Hematological: Does not bruise/bleed easily.  All other systems reviewed and are negative.    Physical Exam Updated Vital  Signs BP 106/86   Pulse (!) 102   Temp 98.2 F (36.8 C) (Oral)   Resp 14   Ht 5\' 2"  (1.575 m)   Wt 58.1 kg   SpO2 91% Comment: 91% at rest. After ambulations 88%. Pt states i feel better. no dizziness  BMI 23.41 kg/m   Physical Exam Vitals signs and nursing note reviewed.  Constitutional:      Appearance: She is well-developed.  HENT:     Head: Normocephalic and atraumatic.  Neck:     Musculoskeletal: Normal range of motion and neck supple.  Cardiovascular:     Rate and Rhythm: Normal rate.  Pulmonary:     Effort: Pulmonary effort is normal.     Breath sounds: Examination of the right-upper field reveals wheezing. Examination of the left-upper field reveals wheezing. Examination of the right-middle field reveals wheezing. Examination of the left-middle field reveals wheezing. Examination of the right-lower field reveals wheezing. Examination of the left-lower field reveals wheezing. Wheezing present.  Abdominal:     General: Bowel sounds are normal.  Skin:    General: Skin is warm and dry.  Neurological:     Mental Status: She is alert and oriented to person, place, and time.      ED Treatments / Results  Labs (all labs ordered are listed, but only abnormal results are displayed) Labs Reviewed  BASIC METABOLIC PANEL - Abnormal; Notable for the following components:      Result Value   Potassium 3.3 (*)    Glucose, Bld 108 (*)    Calcium 8.8 (*)    All other components within normal limits  CBC WITH DIFFERENTIAL/PLATELET - Abnormal; Notable for the following components:   WBC 12.6 (*)    RBC 5.34 (*)    Hemoglobin 15.6 (*)    HCT 50.4 (*)    All other components within normal limits    EKG EKG Interpretation  Date/Time:  Sunday September 28 2018 11:21:31 EST Ventricular Rate:  88 PR Interval:    QRS Duration: 72 QT Interval:  360 QTC Calculation: 436 R Axis:   80 Text Interpretation:  Sinus rhythm Short PR interval Low voltage, extremity and precordial  leads Minimal ST elevation, inferior leads No acute changes Confirmed by Derwood Kaplan [id number]) on 09/28/2018 11:45:15 AM   Radiology Dg Chest 2 View  Result Date: 09/28/2018 CLINICAL DATA:  Cough and congestion for 1 week. Shortness of breath. EXAM: CHEST - 2 VIEW COMPARISON:  02/22/2018 FINDINGS: The cardiac silhouette, mediastinal and hilar contours are within normal limits and stable. Stable emphysematous changes and areas of pulmonary scarring. No acute overlying pulmonary process is identified. No pleural effusions. The bony thorax is intact. IMPRESSION: Chronic emphysematous changes but no definite acute overlying pulmonary process. Electronically Signed   By: Rudie Meyer M.D.   On: 09/28/2018 11:42    Procedures .Critical Care Performed  by: Derwood Kaplan, MD Authorized by: Derwood Kaplan, MD   Critical care provider statement:    Critical care time (minutes):  32   Critical care was necessary to treat or prevent imminent or life-threatening deterioration of the following conditions:  Respiratory failure   Critical care was time spent personally by me on the following activities:  Discussions with consultants, evaluation of patient's response to treatment, examination of patient, ordering and performing treatments and interventions, ordering and review of laboratory studies, ordering and review of radiographic studies, pulse oximetry, re-evaluation of patient's condition, obtaining history from patient or surrogate and review of old charts   (including critical care time)  Medications Ordered in ED Medications  albuterol (PROVENTIL) (2.5 MG/3ML) 0.083% nebulizer solution (2.5 mg  Given 09/28/18 1134)  ipratropium-albuterol (DUONEB) 0.5-2.5 (3) MG/3ML nebulizer solution (3 mLs  Given 09/28/18 1134)  albuterol (PROVENTIL,VENTOLIN) solution continuous neb (10 mg/hr Nebulization Given 09/28/18 1358)  ipratropium (ATROVENT) nebulizer solution 0.5 mg (0.5 mg Nebulization Given 09/28/18 1358)    methylPREDNISolone sodium succinate (SOLU-MEDROL) 125 mg/2 mL injection 125 mg (125 mg Intravenous Given 09/28/18 1332)     Initial Impression / Assessment and Plan / ED Course  I have reviewed the triage vital signs and the nursing notes.  Pertinent labs & imaging results that were available during my care of the patient were reviewed by me and considered in my medical decision making (see chart for details).  Clinical Course as of Sep 28 1518  Wynelle Link Sep 28, 2018  1517 Repeat exam reveals clearing of wheezing in all lung fields. Patient is not in any respiratory distress nor is there hypoxia.  Patient was also ambulated and her O2 sats did not drop less than 88%.  Patient states that she did not feel short of breath that she was walking and feels like she was at baseline.  Patient would like to go home at this time.  I am comfortable with her disposition as well.  Strict ER return precautions discussed.   [AN]    Clinical Course User Index [AN] Derwood Kaplan, MD    68 year old comes in with chief complaint of shortness of breath and wheezing. She has history of advanced COPD on 3 L of O2 at nighttime.  Patient is requiring oxygen during the daytime when she is ambulating.  On exam she is noted to have diffuse wheezing.  There is no focal rhonchi and patient denies any fevers or flulike symptoms.  Patient also denies any sick contacts.  Unfortunately patient continues to smoke half a pack to pack a day.  We will give her multiple nebulizer treatment and reassess.  Smoking cessation instruction/counseling given:  counseled patient on the dangers of tobacco use, advised patient to stop smoking, and reviewed strategies to maximize success.    Final Clinical Impressions(s) / ED Diagnoses   Final diagnoses:  COPD exacerbation [facility])    ED Discharge Orders         Ordered    predniSONE (DELTASONE) 10 MG tablet  Daily     09/28/18 1512    doxycycline (VIBRAMYCIN) 100 MG capsule  2  times daily     09/28/18 1512           Derwood Kaplan, MD 09/28/18 [id number]

## 2019-04-23 ENCOUNTER — Other Ambulatory Visit: Payer: Self-pay | Admitting: Sports Medicine

## 2019-04-23 DIAGNOSIS — M25511 Pain in right shoulder: Secondary | ICD-10-CM

## 2019-04-23 DIAGNOSIS — M75100 Unspecified rotator cuff tear or rupture of unspecified shoulder, not specified as traumatic: Secondary | ICD-10-CM

## 2019-05-20 ENCOUNTER — Ambulatory Visit
Admission: RE | Admit: 2019-05-20 | Discharge: 2019-05-20 | Disposition: A | Discharge status: Home / Self Care | Payer: Medicare Other | Source: Ambulatory Visit | Attending: Sports Medicine | Admitting: Sports Medicine

## 2019-05-20 ENCOUNTER — Other Ambulatory Visit: Payer: Self-pay

## 2019-05-20 DIAGNOSIS — M25511 Pain in right shoulder: Secondary | ICD-10-CM

## 2019-05-20 DIAGNOSIS — M75100 Unspecified rotator cuff tear or rupture of unspecified shoulder, not specified as traumatic: Secondary | ICD-10-CM

## 2019-05-28 ENCOUNTER — Encounter: Payer: Self-pay | Admitting: Neurology

## 2019-05-29 ENCOUNTER — Other Ambulatory Visit: Payer: Self-pay

## 2019-05-29 DIAGNOSIS — R202 Paresthesia of skin: Secondary | ICD-10-CM

## 2019-06-02 ENCOUNTER — Other Ambulatory Visit: Payer: Self-pay

## 2019-06-02 ENCOUNTER — Ambulatory Visit (INDEPENDENT_AMBULATORY_CARE_PROVIDER_SITE_OTHER): Payer: Medicare Other | Admitting: Neurology

## 2019-06-02 DIAGNOSIS — M5412 Radiculopathy, cervical region: Secondary | ICD-10-CM

## 2019-06-02 DIAGNOSIS — R202 Paresthesia of skin: Secondary | ICD-10-CM | POA: Diagnosis not present

## 2019-06-02 NOTE — Procedures (Signed)
[facility] Neurology  [address]  [place], [place] [address] Tel: [phone] Fax:  [phone] Test Date:  06/02/2019  Patient: Erin Miranda DOB: 11-07-1950 Physician: Narda Amber, DO  Sex: Female Height: 5\' 2"  Ref Phys: Wandra Feinstein, MD  ID#: [id number] Temp: 32.0C Technician:    Patient Complaints: This is a 68 year old female referred for evaluation of right proximal arm weakness and muscle atrophy.  NCV & EMG Findings: Extensive electrodiagnostic testing of the right upper extremity shows:  1. All sensory responses including the right median, ulnar, radial, medial and lateral antebrachial cutaneous sensory responses are within normal limits. 2. Right median and ulnar motor responses are within normal limits. 3. Severe active on chronic motor axonal loss changes are seen affecting the biceps, deltoid, brachioradialis, infraspinatus, and supraspinatus muscles.  Additionally, active denervation is present in the cervical paraspinal muscles.  Impression: 1. 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  Overall, these findings are very severe in degree electrically and worse at the C5 nerve root level. 2. There is no evidence of a right brachial plexopathy or isolated suprascapular neuropathy.   ___________________________ Narda Amber, DO    Nerve Conduction Studies Anti Sensory Summary Table   Site NR Peak (ms) Norm Peak (ms) P-T Amp (V) Norm P-T Amp  Right Lat Ante Brach Cutan Anti Sensory (Lat Forearm)  32C  Lat Biceps    2.2 <2.8 11.5 >10  Right Med Ante Brach Cutan Anti Sensory (Med Forearm)  32C  Elbow    2.4  12.8   Right Median Anti Sensory (2nd Digit)  32C  Wrist    2.8 <3.8 31.2 >10  Right Radial Anti Sensory (Base 1st Digit)  32C  Wrist    2.6 <2.8 21.6 >10  Right Ulnar Anti Sensory (5th Digit)  32C  Wrist    2.6 <3.2 23.5 >5   Motor Summary Table   Site NR Onset (ms) Norm Onset (ms) O-P Amp (mV)  Norm O-P Amp Site1 Site2 Delta-0 (ms) Dist (cm) Vel (m/s) Norm Vel (m/s)  Right Median Motor (Abd Poll Brev)  32C  Wrist    3.0 <4.0 10.0 >5 Elbow Wrist 5.4 28.0 52 >50  Elbow    8.4  9.4         Right Ulnar Motor (Abd Dig Minimi)  32C  Wrist    2.8 <3.1 10.3 >7 B Elbow Wrist 4.0 22.0 55 >50  B Elbow    6.8  9.9  A Elbow B Elbow 1.7 10.0 59 >50  A Elbow    8.5  8.7          EMG   Side Muscle Ins Act Fibs Psw Fasc Number Recrt Dur Dur. Amp Amp. Poly Poly. Comment  Right 1stDorInt Nml Nml Nml Nml Nml Nml Nml Nml Nml Nml Nml Nml N/A  Right PronatorTeres Nml Nml Nml Nml Nml Nml Nml Nml Nml Nml Nml Nml N/A  Right Ext Indicis Nml Nml Nml Nml Nml Nml Nml Nml Nml Nml Nml Nml N/A  Right Biceps Nml 2+ Nml Nml [facility] Rapid All 1+ All 1+ All 1+ ATR  Right Triceps Nml Nml Nml Nml Nml Nml Nml Nml Nml Nml Nml Nml N/A  Right Deltoid Nml 2+ Nml Nml [facility] Rapid All 1+ All 1+ All 1+ ATR  Right BrachioRad Nml Nml Nml Nml 3- Rapid Many 1+ Many 1+ Many 1+ N/A  Right Infraspinatus Nml 2+ Nml Nml [facility] Rapid All 1+ All  1+ All 1+ ATR  Right Cervical Parasp Low Nml 1+ Nml Nml NE - - - - - - - N/A  Right Supraspinatus Nml 2+ Nml Nml [facility] Rapid All 1+ All 1+ All 1+ N/A      Waveforms:

## 2019-06-03 ENCOUNTER — Other Ambulatory Visit: Payer: Self-pay | Admitting: Orthopaedic Surgery

## 2019-06-03 DIAGNOSIS — M542 Cervicalgia: Secondary | ICD-10-CM

## 2019-06-22 ENCOUNTER — Other Ambulatory Visit: Payer: Self-pay

## 2019-06-22 ENCOUNTER — Ambulatory Visit
Admission: RE | Admit: 2019-06-22 | Discharge: 2019-06-22 | Disposition: A | Discharge status: Home / Self Care | Payer: Medicare Other | Source: Ambulatory Visit | Attending: Orthopaedic Surgery | Admitting: Orthopaedic Surgery

## 2019-06-22 DIAGNOSIS — M542 Cervicalgia: Secondary | ICD-10-CM

## 2019-06-26 ENCOUNTER — Other Ambulatory Visit: Payer: Self-pay | Admitting: Neurological Surgery

## 2019-06-26 ENCOUNTER — Other Ambulatory Visit (HOSPITAL_COMMUNITY)
Admission: RE | Admit: 2019-06-26 | Discharge: 2019-06-26 | Disposition: A | Discharge status: Home / Self Care | Payer: Medicare Other | Source: Ambulatory Visit | Attending: Neurological Surgery | Admitting: Neurological Surgery

## 2019-06-26 DIAGNOSIS — Z01812 Encounter for preprocedural laboratory examination: Secondary | ICD-10-CM | POA: Diagnosis present

## 2019-06-26 DIAGNOSIS — Z20828 Contact with and (suspected) exposure to other viral communicable diseases: Secondary | ICD-10-CM | POA: Insufficient documentation

## 2019-06-27 LAB — NOVEL CORONAVIRUS, NAA (HOSP ORDER, SEND-OUT TO REF LAB; TAT 18-24 HRS): SARS-CoV-2, NAA: NOT DETECTED

## 2019-06-30 ENCOUNTER — Ambulatory Visit: Admit: 2019-06-30 | Payer: Medicare Other | Admitting: Neurological Surgery

## 2019-06-30 SURGERY — ANTERIOR CERVICAL DECOMPRESSION/DISCECTOMY FUSION 2 LEVELS
Anesthesia: General

## 2019-12-19 DEATH — deceased

## 2021-03-19 IMAGING — MR MR CERVICAL SPINE W/O CM
4 of 5 series · 27 of 48 positions shown · non-contrast
Comparison: Prior MRI from 04/07/2006.

CLINICAL DATA: Initial evaluation for neck pain with right arm pain
and numbness for 8 weeks.

EXAM:
MRI CERVICAL SPINE WITHOUT CONTRAST
TECHNIQUE: Multiplanar, multisequence MR imaging of the cervical spine was
performed. No intravenous contrast was administered.

[Series 3: tir sag · sagittal · 3.0mm · 0.41mm/px · 5 of 13 slices shown]
[im 1/13]
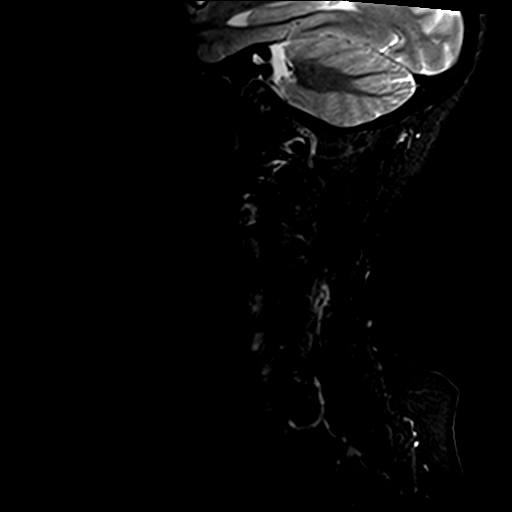
[im 3/13]
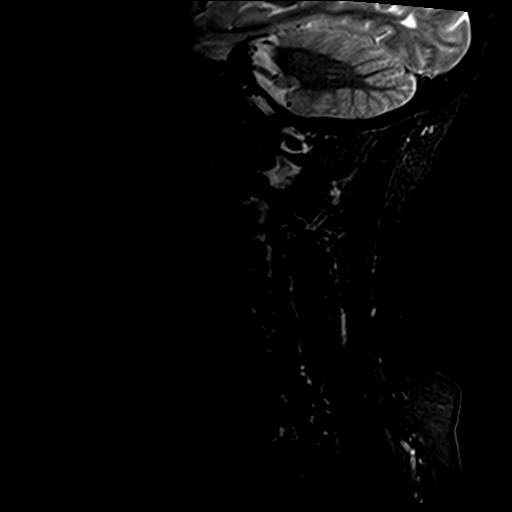
[im 5/13]
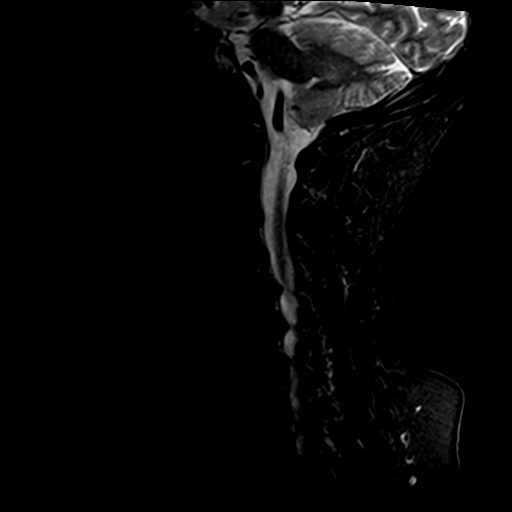
[im 8/13]
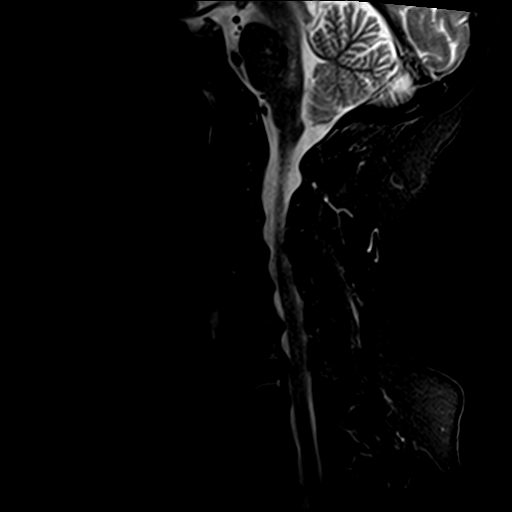
[im 13/13]
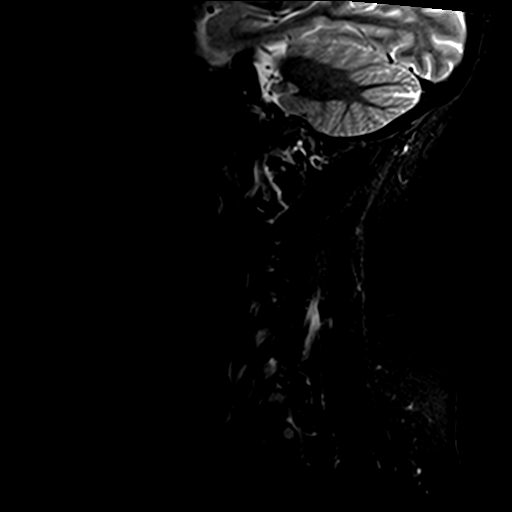

[Series 4: T1 · sagittal · 3.0mm · 0.41mm/px · 7 of 13 slices shown]
[im 1/13]
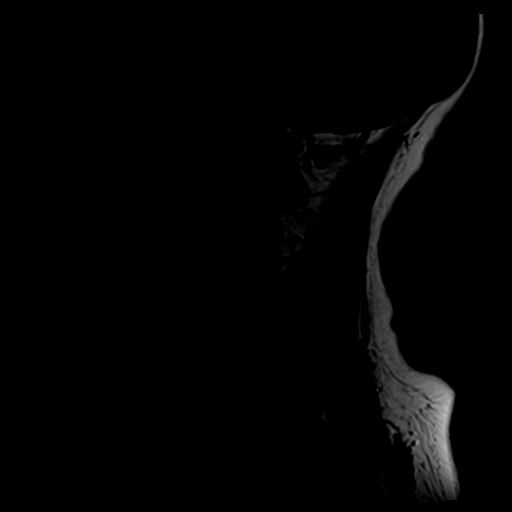
[im 3/13]
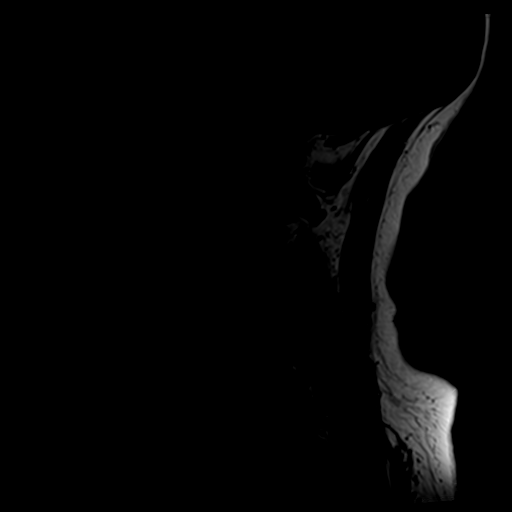
[im 5/13]
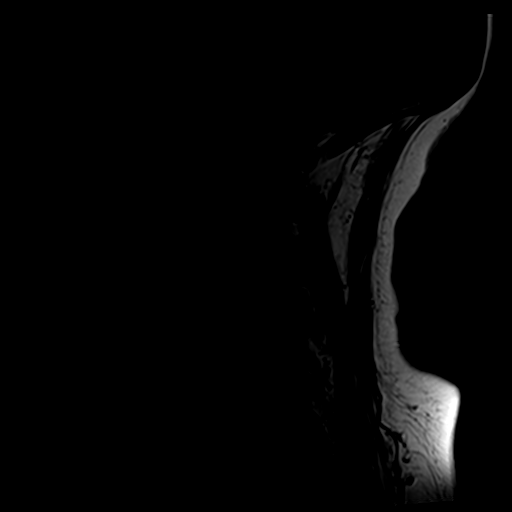
[im 7/13]
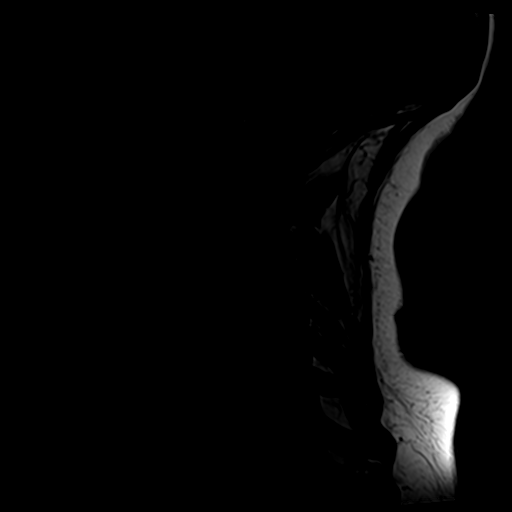
[im 9/13]
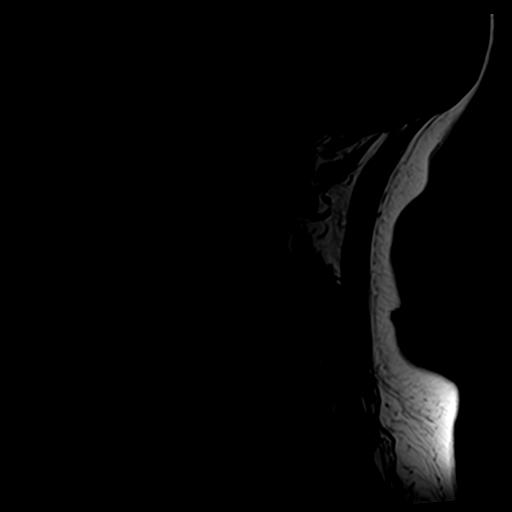
[im 11/13]
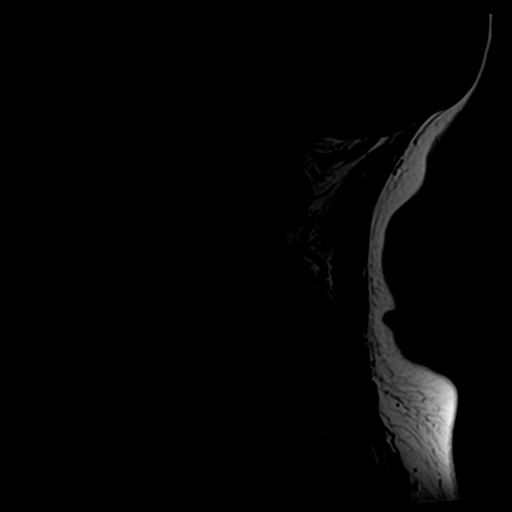
[im 13/13]
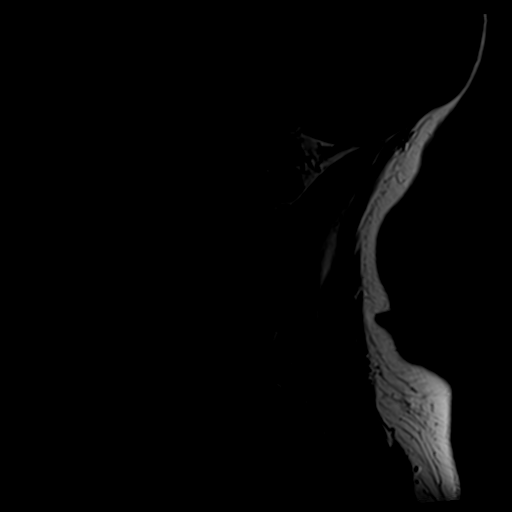

[Series 5: T2 · sagittal · 3.0mm · 0.66mm/px · 7 of 13 slices shown (1 of 2)]
[im 1/13]
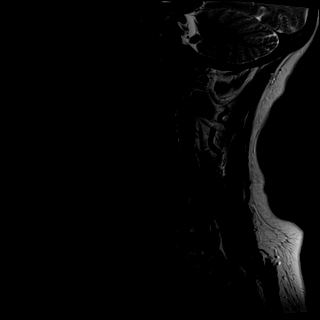
[im 3/13]
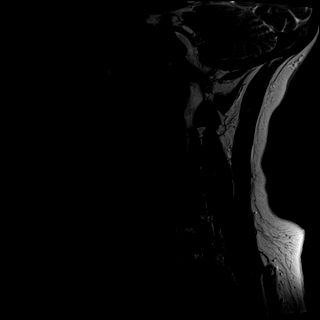
[im 5/13]
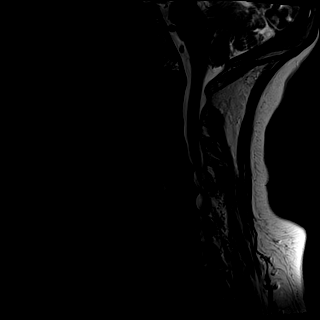
[im 7/13]
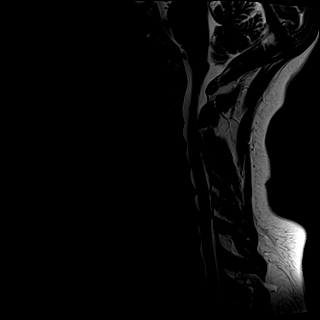
[im 9/13]
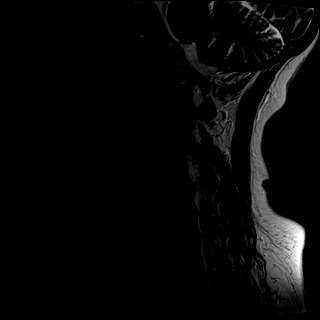
[im 11/13]
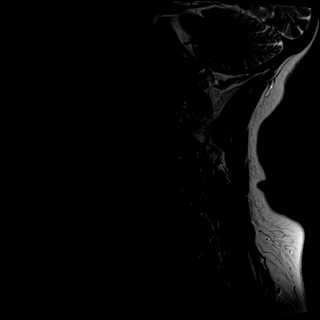
[im 13/13]
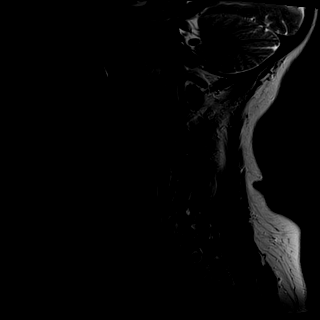

[Series 7: T2 · axial · 3.0mm · 0.70mm/px · z∈[-77,+24]mm · 8 of 28 slices shown (2 of 2)]
[im 1/28]
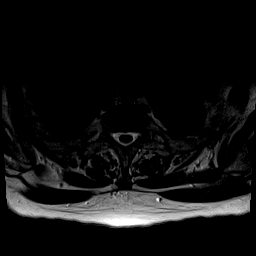
[im 5/28]
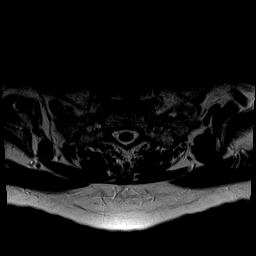
[im 9/28]
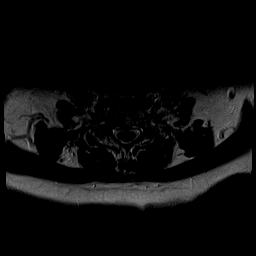
[im 13/28]
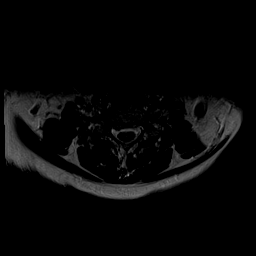
[im 15/28]
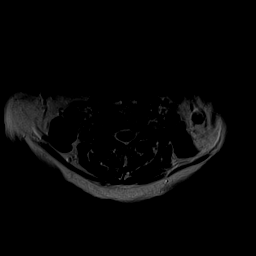
[im 19/28]
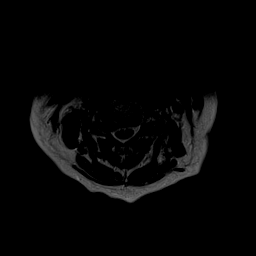
[im 23/28]
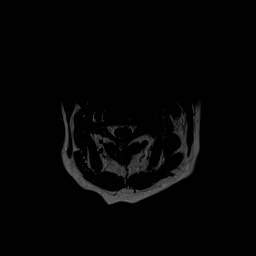
[im 28/28]
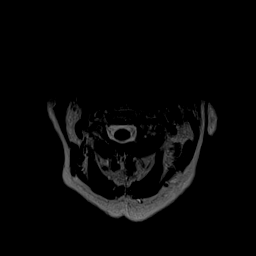

[27 of 48 positions shown; findings below may reference images not displayed]

FINDINGS: Alignment: Straightening of the normal cervical lordosis. Trace 2 mm
anterolisthesis of C3 on C4, chronic and facet mediated.

Vertebrae: Vertebral body height maintained without evidence for
acute or chronic fracture. Bone marrow signal intensity within
normal limits. No discrete or worrisome osseous lesions. No abnormal
marrow edema.

Cord: Signal intensity within the cervical spinal cord is within
normal limits.

Posterior Fossa, vertebral arteries, paraspinal tissues: Probable
mild chronic microvascular ischemic changes noted within the pons
and brainstem. Visualized brain and posterior fossa otherwise
unremarkable. Craniocervical junction within normal limits.
Paraspinous and prevertebral soft tissues normal. Normal
intravascular flow voids seen within the vertebral arteries
bilaterally.

Disc levels:

C2-C3: Mild annular disc bulge with bilateral uncovertebral
hypertrophy. No significant spinal stenosis. Mild left C3 foraminal
narrowing.

C3-C4: Trace anterolisthesis. Mild disc bulge with uncovertebral
hypertrophy. Prominent left-sided facet degeneration. No significant
spinal stenosis. Moderate bilateral C4 foraminal narrowing, worse on
the left.

C4-C5: Right eccentric disc osteophyte complex with intervertebral
disc space narrowing. Broad posterior component flattens the ventral
thecal sac, greater on the right. Mild flattening of the right hemi
cord without cord signal changes. Mild spinal stenosis. Severe right
with moderate left C5 foraminal stenosis.

C5-C6: Chronic intervertebral disc space narrowing with diffuse
degenerative disc osteophyte. Broad posterior component flattens and
partially effaces the ventral thecal sac resultant mild-to-moderate
spinal stenosis. No cord deformity. Right greater than left uncinate
spurring with resultant severe right worse than left C6 foraminal
narrowing.

C6-C7: Chronic intervertebral disc space narrowing with diffuse
degenerative disc osteophyte. Flattening and effacement of the
ventral thecal sac with resultant moderate spinal stenosis. Mild
cord flattening without cord signal changes. Severe bilateral C7
foraminal stenosis.

C7-T1: Diffuse disc bulge with bilateral uncovertebral and facet
hypertrophy. No significant spinal stenosis. Mild bilateral C8
foraminal narrowing.

Visualized upper thoracic spine demonstrates minor disc bulging at
T1-2 and T2-3 without significant spinal stenosis. Mild-to-moderate
bilateral foraminal narrowing at T2-3 related to disc bulge and
facet degeneration.
IMPRESSION: 1. Multilevel cervical spondylolysis with resultant mild to moderate
diffuse spinal stenosis at C4-5 through C6-7.
2. Multifactorial degenerative changes with resultant multilevel
foraminal narrowing as above. Notable findings include moderate
bilateral C4 foraminal stenosis, severe right with moderate left C5
foraminal stenosis, with severe bilateral C6 and C7 foraminal
stenosis.
